# Patient Record
Sex: Female | Born: 1965 | Race: White | Hispanic: No | Marital: Married
Health system: Southern US, Community
[De-identification: ages and names within clinical notes are randomized; demographics above are authoritative.]

## PROBLEM LIST (undated history)

## (undated) DIAGNOSIS — G4730 Sleep apnea, unspecified: Secondary | ICD-10-CM

## (undated) HISTORY — DX: Sleep apnea, unspecified: G47.30

## (undated) HISTORY — PX: TONSILLECTOMY: SUR1361

## (undated) HISTORY — PX: FOOT SURGERY: SHX648

---

## 2015-02-04 ENCOUNTER — Other Ambulatory Visit: Payer: Self-pay | Admitting: Family Medicine

## 2015-02-04 DIAGNOSIS — Z1231 Encounter for screening mammogram for malignant neoplasm of breast: Secondary | ICD-10-CM

## 2015-02-09 ENCOUNTER — Ambulatory Visit
Admission: RE | Admit: 2015-02-09 | Discharge: 2015-02-09 | Disposition: A | Discharge status: Home / Self Care | Payer: Managed Care, Other (non HMO) | Source: Ambulatory Visit | Attending: Family Medicine | Admitting: Family Medicine

## 2015-02-09 DIAGNOSIS — Z1231 Encounter for screening mammogram for malignant neoplasm of breast: Secondary | ICD-10-CM

## 2016-05-11 ENCOUNTER — Other Ambulatory Visit: Payer: Self-pay | Admitting: Family Medicine

## 2016-05-11 DIAGNOSIS — Z1231 Encounter for screening mammogram for malignant neoplasm of breast: Secondary | ICD-10-CM

## 2017-02-20 ENCOUNTER — Encounter: Payer: Commercial Managed Care - PPO | Attending: Family Medicine | Admitting: *Deleted

## 2017-02-20 ENCOUNTER — Encounter: Payer: Self-pay | Admitting: *Deleted

## 2017-02-20 DIAGNOSIS — G4730 Sleep apnea, unspecified: Secondary | ICD-10-CM | POA: Diagnosis not present

## 2017-02-20 DIAGNOSIS — F509 Eating disorder, unspecified: Secondary | ICD-10-CM | POA: Diagnosis not present

## 2017-02-20 DIAGNOSIS — Z713 Dietary counseling and surveillance: Secondary | ICD-10-CM | POA: Diagnosis present

## 2017-02-20 NOTE — Progress Notes (Signed)
Medical Nutrition Therapy:  Appt start time: 0800 end time:  0900.   Assessment:  Primary concerns today: I have been working with her son on his disordered eating.  Ashley Charles states she wants to work with me also .  Diagnosed with mild/moderate sleep apnea.  Got C-Pap and feels so much better.  She has been feeling poorly for ages and taken multiple medications without help.  Is so pleased the past couple days.   Worked with another RD for weight loss and is transitioning to this provider.   Family (parents) history of disordered eating and body dysmorphia.  Son also with potential ARFID She is not happy in her skin.  She is concerned about her weight.  She thinks she is a binge eater.  States she knows what to eat as she has been through multiple diets and dietitians and programs.  She is so bombarded with information that she doesn't know how to take care of herself or her family.  She wants to be able to cook for her family.  She doesn't want to have to make 2 different meals.   Lost 100 pounds 9 years ago on her on going to [employer] and exercising.  Wrote everything down and thinks that was helpful.  She is steeped in diet mentality and feels she needs to eat differently than her family because she is larger.  She regained the weight and then lost it and then regained it.    She doesn't have clothes that she enjoys.  She doesnt' dress to take care of this body  States she eats bags of chocolate Is working with therapist on mindfulness in general.  Is not working on food.    EAT-26 score: 22 Binge 1/week  Preferred Learning Style:  No preference indicated   Learning Readiness:   Ready   MEDICATIONS: see list   DIETARY INTAKE:  Usual eating pattern includes 1-2 meals and 1 snacks per day.   Avoided foods include sugar (thinks it's a trigger), bread, dairy (likes siggy's yogurt).    24-hr recall:  B ( AM): organic valley protein shake  Snk ( AM): none  L ( PM): 2  handfuls cashews Snk ( PM): 4 butter buddies D ( PM): popcorn and large hot dog Snk ( PM): none Beverages: coffee with collagen powder added  Usual physical activity: none    Nutritional Diagnosis:  NB-1.5 Disordered eating pattern As related to meal skipping and binge eating.  As evidenced by dietary recall.    Intervention:  Nutrition counseling provided.  Binging will always occur if you don't get enough to eat.  She agreed to try to have 3 meals at the table.    Teaching Method Utilized: Auditory   Barriers to learning/adherence to lifestyle change: DE mindset  Demonstrated degree of understanding via:  Teach Back   Monitoring/Evaluation:in 2 week(s).

## 2017-03-05 ENCOUNTER — Ambulatory Visit: Payer: Managed Care, Other (non HMO) | Admitting: *Deleted

## 2017-03-19 ENCOUNTER — Ambulatory Visit: Payer: Managed Care, Other (non HMO) | Admitting: *Deleted

## 2017-03-27 ENCOUNTER — Encounter: Payer: Commercial Managed Care - PPO | Attending: Family Medicine | Admitting: *Deleted

## 2017-03-27 DIAGNOSIS — G4730 Sleep apnea, unspecified: Secondary | ICD-10-CM | POA: Diagnosis not present

## 2017-03-27 DIAGNOSIS — Z713 Dietary counseling and surveillance: Secondary | ICD-10-CM | POA: Insufficient documentation

## 2017-03-27 DIAGNOSIS — F509 Eating disorder, unspecified: Secondary | ICD-10-CM | POA: Diagnosis not present

## 2017-03-27 NOTE — Progress Notes (Signed)
Medical Nutrition Therapy:  Appt start time: 0800 end time:  0900.   Assessment:  Primary concerns today: I have been working with her son on his disordered eating.  Cala BradfordKimberly states she wants to work with me also  [facility].  Plans to stay with this provider Gracy BruinsMargaret Veech for therapy Is using CPap machine  States she "was horrible at eating 3 meals/day".  Still attends Overeaters Anonymous.    Preferred Learning Style:  No preference indicated   Learning Readiness:   Ready   MEDICATIONS: see list   DIETARY INTAKE:  Usual eating pattern includes 1-2 meals and 1 snacks per day.   Avoided foods include sugar (thinks it's a trigger), bread, dairy (likes siggy's yogurt).    24-hr recall:  B: trim healthy mama (collagen), granola and yogurt L: chocolate nuggets D: mcdonald's 1/4 pounder and fries.  water S: Ice cream cones 2  Beverages: water, coffee  Usual physical activity: swimming some    Nutritional Diagnosis:  NB-1.5 Disordered eating pattern As related to meal skipping and binge eating.  As evidenced by dietary recall.    Intervention:  Nutrition counseling provided.  Binging will always occur if you don't get enough to eat.  She agreed to try to have 3 meals at the table.  Suggested Body Worx group and talking to her therapist about experience with BED.   Challenged diet mentality.  Empowered her to make changes  Teaching Method Utilized: Auditory   Barriers to learning/adherence to lifestyle change: DE mindset  Demonstrated degree of understanding via:  Teach Back   Monitoring/Evaluation:in 2 week(s).

## 2017-03-27 NOTE — Patient Instructions (Signed)
Tess ALLTEL CorporationHoliday [address] Thore Jessamyn ProphetstownStanley Louise Green Mirna Valerio Jes Engineer, productionBaker Virgie Tovar Regan Chastain   Things No One Tells Fat Girls by Kirke ShaggyJes Baker

## 2017-04-10 ENCOUNTER — Encounter: Payer: Commercial Managed Care - PPO | Attending: Family Medicine | Admitting: *Deleted

## 2017-04-10 DIAGNOSIS — Z713 Dietary counseling and surveillance: Secondary | ICD-10-CM | POA: Insufficient documentation

## 2017-04-10 DIAGNOSIS — G4730 Sleep apnea, unspecified: Secondary | ICD-10-CM | POA: Diagnosis not present

## 2017-04-10 DIAGNOSIS — F509 Eating disorder, unspecified: Secondary | ICD-10-CM | POA: Diagnosis not present

## 2017-04-10 NOTE — Progress Notes (Signed)
Medical Nutrition Therapy:  Appt start time: 0800 end time:  0900.   Assessment:  Primary concerns today: I have been working with her son on his disordered eating.  Ashley Charles states she wants to work with me also   Hospital doctorWent to Clear Channel Communicationsoe's Kitchen last night and feels really good about it. She has been having more structured meals (they are from restaurants) but still meals.  Has also been making dinners at home the past couple nights Talked with husband about making time for herself and taking care of herself: 3 meals/day Going to bed on time Taking time in the morning for herself Going to appointments (meetings)* Plan dinners with husband ahead of time Walk more up and down the stairs- got a step to practice*.   Try swimming again Buy her own chocolate and not ask anyone else to*  Continues to see Ashley Charles, RD Signed up for Body Worx with [employer] .    Preferred Learning Style:  No preference indicated   Learning Readiness:   Ready   MEDICATIONS: see list   DIETARY INTAKE:  Usual eating pattern includes 1-2 meals and 1 snacks per day.   Avoided foods include sugar (thinks it's a trigger), bread, dairy (likes siggy's yogurt).       Nutritional Diagnosis:  NB-1.5 Disordered eating pattern As related to meal skipping and binge eating.  As evidenced by dietary recall.    Intervention:  Nutrition counseling provided.  Discussed conflicting messages from OA.  Discussed increasing body movement and structured meals   Teaching Method Utilized: Auditory   Barriers to learning/adherence to lifestyle change: DE mindset  Demonstrated degree of understanding via:  Teach Back   Monitoring/Evaluation:in 2 week(s).

## 2017-04-10 NOTE — Patient Instructions (Signed)
Step at home Silver Sneaker with mom Walking in the pool  [place] Islands[place]li Schoone with Librarian, academicAnchor Strength Training [phone] personal trainer  3 meals with starch and protein cooked in fat with fruit or vegetable on the side

## 2017-04-24 ENCOUNTER — Encounter: Payer: Commercial Managed Care - PPO | Admitting: *Deleted

## 2017-04-24 DIAGNOSIS — F5081 Binge eating disorder: Secondary | ICD-10-CM

## 2017-04-24 DIAGNOSIS — Z713 Dietary counseling and surveillance: Secondary | ICD-10-CM | POA: Diagnosis not present

## 2017-04-24 NOTE — Progress Notes (Signed)
Medical Nutrition Therapy:  Appt start time: 0800 end time:  0900.   Assessment:  Primary concerns today: BED  Has been eating 3 meals/day some days.  Has been eating less bags of chocolate.  Has been flipping through Intuitive Eating Also got the workbook that has various exercises.  Has questions about gentle nutrition Got prescription goggles for swimming      Talked with husband about making time for herself and taking care of herself: 3 meals/day Going to bed on time Taking time in the morning for herself Going to appointments (meetings)* Plan dinners with husband ahead of time Walk more up and down the stairs- got a step to practice*.   Try swimming again Buy her own chocolate and not ask anyone else to*      Preferred Learning Style:  No preference indicated   Learning Readiness:   Ready   MEDICATIONS: see list   DIETARY INTAKE:  Usual eating pattern includes 1-2 meals and 1 snacks per day.   Avoided foods include sugar (thinks it's a trigger), bread, dairy (likes siggy's yogurt).       Nutritional Diagnosis:  NB-1.5 Disordered eating pattern As related to meal skipping and binge eating.  As evidenced by dietary recall.    Intervention:  Nutrition counseling provided.  Discussed conflicting messages from OA.  Discussed structured meals   Teaching Method Utilized: Auditory   Barriers to learning/adherence to lifestyle change: DE mindset  Demonstrated degree of understanding via:  Teach Back   Monitoring/Evaluation:in 1 week(s).

## 2017-05-01 ENCOUNTER — Encounter: Payer: Commercial Managed Care - PPO | Admitting: *Deleted

## 2017-05-01 DIAGNOSIS — Z713 Dietary counseling and surveillance: Secondary | ICD-10-CM | POA: Diagnosis not present

## 2017-05-01 DIAGNOSIS — F509 Eating disorder, unspecified: Secondary | ICD-10-CM

## 2017-05-01 NOTE — Patient Instructions (Signed)
Anti-inflammatory foods High fruit and vegetable intake High fiber, whole grains Plant-based fats Spices and herbs Omega-3 fatty acid:  Supplement with Nature's Made [phone] mg DHA (take 2-4/day) and that is 3rd party tested also Kirdland brand       Foods include: olive oil, avocado, canola oil, flax seeds, macadamia nuts, hazelnuts, pecans, pistachios, pine nuts.  Salmon, tuna, trout, barramundi

## 2017-05-01 NOTE — Progress Notes (Signed)
Medical Nutrition Therapy:  Appt start time: 0800 end time:  0900.   Assessment:  Primary concerns today: BED  Went walking in the pool 2 days this week.  Started Body Worx Started reading Cendant Corporationhe Body is Not an Apology and is also listening in Audible.  Continues to read Intuitive Eating Trying to challenge diet mentality     Preferred Learning Style:  No preference indicated   Learning Readiness:   Ready   MEDICATIONS: see list   DIETARY INTAKE:  Usual eating pattern includes 1-2 meals and 1 snacks per day. B: coffee with collagen powder Protein shake Banana Taco bell: 2 tacos and burrito 2 ice cream cones     Nutritional Diagnosis:  NB-1.5 Disordered eating pattern As related to meal skipping and binge eating.  As evidenced by dietary recall.    Intervention:  Nutrition counseling provided.  Discussed conflicting messages from OA.  Discussed structured meals.  Discussed antiinflammatory foods   Teaching Method Utilized: Auditory   Barriers to learning/adherence to lifestyle change: DE mindset  Demonstrated degree of understanding via:  Teach Back   Monitoring/Evaluation:in 2 week(s).

## 2017-05-15 ENCOUNTER — Encounter: Payer: Commercial Managed Care - PPO | Attending: Family Medicine | Admitting: *Deleted

## 2017-05-15 DIAGNOSIS — Z713 Dietary counseling and surveillance: Secondary | ICD-10-CM | POA: Insufficient documentation

## 2017-05-15 DIAGNOSIS — G4730 Sleep apnea, unspecified: Secondary | ICD-10-CM | POA: Insufficient documentation

## 2017-05-15 DIAGNOSIS — F509 Eating disorder, unspecified: Secondary | ICD-10-CM | POA: Insufficient documentation

## 2017-05-15 NOTE — Patient Instructions (Addendum)
Anti-inflammatory foods  High fruit and vegetable intake  High fiber, whole grains  Plant-based fats  Spices and herbs  Omega-3 fatty acid:   Supplement with Nature's Made [phone] mg DHA (take 2-4/day) and that is 3rd party tested also Kirdland brand        Foods include: olive oil, avocado, canola oil, flax seeds, macadamia nuts, hazelnuts, pecans, pistachios, pine nuts.  Salmon, tuna, trout, barramundi   I'm so proud of you!  3 meals/day [facility] Meditation app

## 2017-05-15 NOTE — Progress Notes (Signed)
Medical Nutrition Therapy:  Appt start time: 0800 end time:  0900.   Assessment:  Primary concerns today: BED Hip feels better sometimes. Diagnosis hip bursitis.  Is not to exacerbate it.  Looked up asli, but is not going to pursue training just now.  Will do stretching, swimming, yoga   Has been eating bananas in the morning Often has toast with PB for lunch and then dinner with her family    Preferred Learning Style:  No preference indicated   Learning Readiness:   Change in progress  MEDICATIONS: see list      Nutritional Diagnosis:  NB-1.5 Disordered eating pattern As related to meal skipping and binge eating.  As evidenced by dietary recall.    Intervention:  Nutrition counseling provided.  Praised progress.  Discussed coping mechanisms.  Discussed body terrorism/body liberation  Teaching Method Utilized: Auditory   Barriers to learning/adherence to lifestyle change: DE mindset  Demonstrated degree of understanding via:  Teach Back   Monitoring/Evaluation:in 2 week(s).

## 2017-05-29 ENCOUNTER — Encounter: Payer: Commercial Managed Care - PPO | Admitting: *Deleted

## 2017-05-29 DIAGNOSIS — Z713 Dietary counseling and surveillance: Secondary | ICD-10-CM | POA: Diagnosis not present

## 2017-05-29 DIAGNOSIS — F509 Eating disorder, unspecified: Secondary | ICD-10-CM

## 2017-05-29 NOTE — Progress Notes (Signed)
Medical Nutrition Therapy:  Appt start time: 0800 end time:  0900.   Assessment:  Primary concerns today: BED Has many questions.  Is struggling with fat shame   Preferred Learning Style:  No preference indicated   Learning Readiness:   Change in progress  MEDICATIONS: see list  24 hour recall B: nothing L: nothing Bag chocolate stouffers lasagna, water    Nutritional Diagnosis:  NB-1.5 Disordered eating pattern As related to meal skipping and binge eating.  As evidenced by dietary recall.    Intervention:  Nutrition counseling provided.   Discussed coping mechanisms.  Discussed body terrorism/body liberation  Teaching Method Utilized: Auditory   Barriers to learning/adherence to lifestyle change: DE mindset  Demonstrated degree of understanding via:  Teach Back   Monitoring/Evaluation:in 2 week(s).

## 2017-05-29 NOTE — Patient Instructions (Addendum)
Ashley Charles  Things No One Tells Fat Girls  Ashley Charles in [place]  [facility] [phone]

## 2017-06-13 ENCOUNTER — Ambulatory Visit: Payer: Managed Care, Other (non HMO) | Admitting: *Deleted

## 2017-06-26 ENCOUNTER — Encounter: Payer: Commercial Managed Care - PPO | Attending: Family Medicine | Admitting: *Deleted

## 2017-06-26 DIAGNOSIS — G4730 Sleep apnea, unspecified: Secondary | ICD-10-CM | POA: Diagnosis not present

## 2017-06-26 DIAGNOSIS — F509 Eating disorder, unspecified: Secondary | ICD-10-CM | POA: Insufficient documentation

## 2017-06-26 DIAGNOSIS — Z713 Dietary counseling and surveillance: Secondary | ICD-10-CM | POA: Diagnosis present

## 2017-06-26 NOTE — Progress Notes (Signed)
Medical Nutrition Therapy:  Appt start time: 0800 end time:  0900.   Assessment:  Primary concerns today: BED Is reading Things No One Tells Fat Girls struggling   Preferred Learning Style:  No preference indicated   Learning Readiness:   Change in progress  MEDICATIONS: see list  24 hour recall poptarts No lunch.  2 cups coffee Eggs and bacon for dinner Brownie potarts   Nutritional Diagnosis:  NB-1.5 Disordered eating pattern As related to meal skipping and binge eating.  As evidenced by dietary recall.    Intervention:  Nutrition counseling provided.   Challenged diet mentality.  Discussed ellyn's satter's normal eating.  Suggested resources  Teaching Method Utilized: Auditory   Barriers to learning/adherence to lifestyle change: DE mindset  Demonstrated degree of understanding via:  Teach Back   Monitoring/Evaluation:in 2 week(s).

## 2017-07-10 ENCOUNTER — Encounter: Payer: Commercial Managed Care - PPO | Attending: Family Medicine | Admitting: *Deleted

## 2017-07-10 DIAGNOSIS — Z713 Dietary counseling and surveillance: Secondary | ICD-10-CM | POA: Insufficient documentation

## 2017-07-10 DIAGNOSIS — F509 Eating disorder, unspecified: Secondary | ICD-10-CM | POA: Diagnosis not present

## 2017-07-10 DIAGNOSIS — G4730 Sleep apnea, unspecified: Secondary | ICD-10-CM | POA: Diagnosis not present

## 2017-07-10 NOTE — Progress Notes (Signed)
Medical Nutrition Therapy:  Appt start time: 0800 end time:  0900.   Assessment:  Primary concerns today: BED Going to PT for her hip Had 3 meals yesterday and breakfast 2 days in a row Denied life insurance.  Not sure why Still reading Things No One Tells Fat Girls Bought Health At Every Size, hasn't started yet    Preferred Learning Style:  No preference indicated   Learning Readiness:   Change in progress  MEDICATIONS: see list  24 hour recall Coffee and coffeecake CFA nuggets and 6 cookies and tea 2 tacos and nuggets   Nutritional Diagnosis:  NB-1.5 Disordered eating pattern As related to meal skipping and binge eating.  As evidenced by dietary recall.    Intervention:  Nutrition counseling provided.   Keep trying Teaching Method Utilized: Auditory   Barriers to learning/adherence to lifestyle change: DE mindset  Demonstrated degree of understanding via:  Teach Back   Monitoring/Evaluation:in 2 week(s).

## 2017-07-24 ENCOUNTER — Telehealth: Payer: Self-pay | Admitting: *Deleted

## 2017-07-24 ENCOUNTER — Ambulatory Visit: Payer: Managed Care, Other (non HMO) | Admitting: *Deleted

## 2017-07-24 NOTE — Telephone Encounter (Signed)
Overslept.  Needs to cancel appt Talked for 25 minutes about her disordered eating

## 2017-08-08 ENCOUNTER — Encounter: Payer: Commercial Managed Care - PPO | Attending: Family Medicine | Admitting: *Deleted

## 2017-08-08 DIAGNOSIS — F509 Eating disorder, unspecified: Secondary | ICD-10-CM | POA: Insufficient documentation

## 2017-08-08 DIAGNOSIS — Z713 Dietary counseling and surveillance: Secondary | ICD-10-CM | POA: Diagnosis not present

## 2017-08-08 DIAGNOSIS — G4730 Sleep apnea, unspecified: Secondary | ICD-10-CM | POA: Insufficient documentation

## 2017-08-08 NOTE — Progress Notes (Signed)
Medical Nutrition Therapy:  Appt start time: 0900 end time:  1000.   Assessment:  Primary concerns today: BED Is pretty stressed so she is eating more.  Has therapy later today PT is helping some and no longer needs cane Ordered new clothes Is trying to love her body and study it   Preferred Learning Style:  No preference indicated   Learning Readiness:   Change in progress  MEDICATIONS: see list  24 hour recall Snacks milkshake   Nutritional Diagnosis:  NB-1.5 Disordered eating pattern As related to meal skipping and binge eating.  As evidenced by dietary recall.    Intervention:  Nutrition counseling provided.   Please continue to try 3 meals/day   Teaching Method Utilized: Auditory   Barriers to learning/adherence to lifestyle change: DE mindset  Demonstrated degree of understanding via:  Teach Back   Monitoring/Evaluation:in 2 week(s).

## 2017-08-22 ENCOUNTER — Ambulatory Visit: Payer: Managed Care, Other (non HMO) | Admitting: *Deleted

## 2017-10-17 ENCOUNTER — Encounter: Payer: Commercial Managed Care - PPO | Attending: Family Medicine | Admitting: *Deleted

## 2017-10-17 DIAGNOSIS — G4730 Sleep apnea, unspecified: Secondary | ICD-10-CM | POA: Insufficient documentation

## 2017-10-17 DIAGNOSIS — Z713 Dietary counseling and surveillance: Secondary | ICD-10-CM | POA: Insufficient documentation

## 2017-10-17 DIAGNOSIS — F509 Eating disorder, unspecified: Secondary | ICD-10-CM

## 2017-10-17 NOTE — Progress Notes (Signed)
Medical Nutrition Therapy:  Appt start time: 0945 end time:  1030   Assessment:  Primary concerns today: BED Is going to more ALANON meetings versus OA , but did go to OA yesterday and realizes the conflicting messages between OA and HAES Is part of a fat acceptance group on Facebook  Is really trying to get in 3 meals Was binging more , but this past week or so is less  Is continuing family therapy Hip is slowly getting better. [place] trip is coming up and gonna take walking sticks.  She's getting stronger.  Went for a walk last week   Preferred Learning Style:  No preference indicated   Learning Readiness:   Change in progress  MEDICATIONS: see list  24 hour recall Banana chocolate Lasagna and bread  This morning had coffee and coffee cake   Nutritional Diagnosis:  NB-1.5 Disordered eating pattern As related to meal skipping and binge eating.  As evidenced by dietary recall.    Intervention:  Nutrition counseling provided.   Please continue to try 3 meals/day.  unfollow triggering social media accounts   Teaching Method Utilized: Auditory   Barriers to learning/adherence to lifestyle change: DE mindset  Demonstrated degree of understanding via:  Teach Back   Monitoring/Evaluation:in 2 week(s).

## 2017-10-31 ENCOUNTER — Encounter: Payer: Commercial Managed Care - PPO | Admitting: *Deleted

## 2017-10-31 DIAGNOSIS — F5081 Binge eating disorder: Secondary | ICD-10-CM

## 2017-10-31 DIAGNOSIS — Z713 Dietary counseling and surveillance: Secondary | ICD-10-CM | POA: Diagnosis not present

## 2017-10-31 NOTE — Progress Notes (Signed)
Medical Nutrition Therapy:  Appt start time: 0800 end time:  0900   Assessment:  Primary concerns today: BED Just got back from [place] and walked a lot Ate 3 meals a day for the trip.   Felt empowered and wants to figure out how to stay consistent Knows she needs to separate her eating from her son's  Went for a few walks in her neighborhood before DC and that went ok Continues to do physical therapy and is getting stronger  Enrolled in intuitive eating group and will be reading the book Nourish  Preferred Learning Style:  No preference indicated   Learning Readiness:   Change in progress  MEDICATIONS: see list  24 hour recall Eggs, sausage, chocolate muffin and coffee Mal[place]urkey sandwich from subway and lays chips.  Water oreos     Nutritional Diagnosis:  NB-1.5 Disordered eating pattern As related to meal skipping and binge eating.  As evidenced by dietary recall.    Intervention:  Nutrition counseling provided.   Try for 10 minute walk each day.  Attend group.  Eat 3 meals   Teaching Method Utilized: Auditory   Barriers to learning/adherence to lifestyle change: DE mindset  Demonstrated degree of understanding via:  Teach Back   Monitoring/Evaluation:in 1 week (s).

## 2017-10-31 NOTE — Patient Instructions (Signed)
Mathis DadBrett debney

## 2017-11-05 ENCOUNTER — Encounter: Payer: Commercial Managed Care - PPO | Attending: Family Medicine | Admitting: *Deleted

## 2017-11-05 DIAGNOSIS — G4730 Sleep apnea, unspecified: Secondary | ICD-10-CM | POA: Insufficient documentation

## 2017-11-05 DIAGNOSIS — Z713 Dietary counseling and surveillance: Secondary | ICD-10-CM | POA: Diagnosis not present

## 2017-11-05 DIAGNOSIS — F509 Eating disorder, unspecified: Secondary | ICD-10-CM | POA: Diagnosis not present

## 2017-11-05 NOTE — Progress Notes (Signed)
Medical Nutrition Therapy:  Appt start time: 0730 end time:  0830   Assessment:  Primary concerns today: BED Started intuitive eating group yesterday.  Will be reading Nourish and Health At Every Size Did not finish Things No One Tells Fat Girls  yet  Playing phone tag with Mathis DadBrett Debney to get scheduled for therapy    Preferred Learning Style:  No preference indicated   Learning Readiness:   Change in progress  MEDICATIONS: see list  24 hour recall Apple Coffee cake and coffee Almonds, raisins Popcorn and swiss cheese Cheese and 6 oreos     Nutritional Diagnosis:  NB-1.5 Disordered eating pattern As related to meal skipping and binge eating.  As evidenced by dietary recall.    Intervention:  Nutrition counseling provided.   Try for 10 minute walk each day.  Attend group.  Eat 3 meals   Teaching Method Utilized: Auditory   Barriers to learning/adherence to lifestyle change: DE mindset  Demonstrated degree of understanding via:  Teach Back   Monitoring/Evaluation:in 1 week (s).

## 2017-11-12 ENCOUNTER — Encounter: Payer: Commercial Managed Care - PPO | Admitting: *Deleted

## 2017-11-12 DIAGNOSIS — Z713 Dietary counseling and surveillance: Secondary | ICD-10-CM | POA: Diagnosis not present

## 2017-11-12 DIAGNOSIS — F509 Eating disorder, unspecified: Secondary | ICD-10-CM

## 2017-11-12 NOTE — Progress Notes (Signed)
Medical Nutrition Therapy:  Appt start time: 0730 end time:  0830   Assessment:  Primary concerns today: BED Continues intuitive eating group.  continues reading Nourish and Health At Every Size  Scheduled with  Mathis DadBrett Debney for therapy    Preferred Learning Style:  No preference indicated   Learning Readiness:   Change in progress  MEDICATIONS: see list  24 hour recall Coffee and coffee cake Bag cadbury eggs 4 chicken strips Another bag of choclate    Nutritional Diagnosis:  NB-1.5 Disordered eating pattern As related to meal skipping and binge eating.  As evidenced by dietary recall.    Intervention:  Nutrition counseling provided.   Try for 10 minute walk each day/do PT work.  Attend group.  Eat 3 meals   Teaching Method Utilized: Auditory   Barriers to learning/adherence to lifestyle change: DE mindset  Demonstrated degree of understanding via:  Teach Back   Monitoring/Evaluation:in 1 week (s).

## 2017-11-19 ENCOUNTER — Encounter: Payer: Commercial Managed Care - PPO | Admitting: *Deleted

## 2017-11-19 DIAGNOSIS — F509 Eating disorder, unspecified: Secondary | ICD-10-CM

## 2017-11-19 DIAGNOSIS — Z713 Dietary counseling and surveillance: Secondary | ICD-10-CM | POA: Diagnosis not present

## 2017-11-19 NOTE — Progress Notes (Signed)
Medical Nutrition Therapy:  Appt start time: 0730 end time:  0830   Assessment:  Primary concerns today: BED Continues intuitive eating group.  continues reading Nourish and Health At Every Size  Scheduled with  Mathis DadBrett Debney for therapy Trying to walk some, but it hurts    Preferred Learning Style:  No preference indicated   Learning Readiness:   Change in progress  MEDICATIONS: see list Banana and coffee Frosted mini wheats (dried) with dried strawberries Spaghetti, 1 square of chocolate    Nutritional Diagnosis:  NB-1.5 Disordered eating pattern As related to meal skipping and binge eating.  As evidenced by dietary recall.    Intervention:  Nutrition counseling provided.   Try for 10 minute walk each day/do PT work.  Attend group.  Eat 3 meals   Teaching Method Utilized: Auditory   Barriers to learning/adherence to lifestyle change: DE mindset  Demonstrated degree of understanding via:  Teach Back   Monitoring/Evaluation:in 1 week (s).

## 2017-11-22 ENCOUNTER — Other Ambulatory Visit: Payer: Self-pay | Admitting: Family Medicine

## 2017-11-22 ENCOUNTER — Other Ambulatory Visit (HOSPITAL_COMMUNITY)
Admission: RE | Admit: 2017-11-22 | Discharge: 2017-11-22 | Disposition: A | Discharge status: Home / Self Care | Payer: Commercial Managed Care - PPO | Source: Ambulatory Visit | Attending: Family Medicine | Admitting: Family Medicine

## 2017-11-22 DIAGNOSIS — Z124 Encounter for screening for malignant neoplasm of cervix: Secondary | ICD-10-CM | POA: Diagnosis not present

## 2017-11-26 ENCOUNTER — Encounter: Payer: Commercial Managed Care - PPO | Admitting: *Deleted

## 2017-11-26 DIAGNOSIS — F509 Eating disorder, unspecified: Secondary | ICD-10-CM

## 2017-11-26 DIAGNOSIS — Z713 Dietary counseling and surveillance: Secondary | ICD-10-CM | POA: Diagnosis not present

## 2017-11-26 NOTE — Progress Notes (Signed)
Medical Nutrition Therapy:  Appt start time: 0730 end time:  0830   Assessment:  Primary concerns today: BED Continues intuitive eating group.  continues reading Nourish and Health At Every Size  Is trying to not harm herself through food and shame and blame any more.  Trying to show herself compassion  Did use arc trainer and that felt better than walking   Was pleased to find out that her weight is stable.  Had overall positive experience at PCP office. Was able to advocate for herself and felt heard and respected by the medical team BP was slightly elevated.Marland Kitchen.Marland Kitchen.Was recommended to try glucosamine and/or tumeric   MEDICATIONS: see list  Dietary Recall: Frosted mini wheat with milk Peanuts and raisins- late Baked ziti and bread Key lime pie     Nutritional Diagnosis:  NB-1.5 Disordered eating pattern As related to meal skipping and binge eating.  As evidenced by dietary recall.    Intervention:  Nutrition counseling provided.   Challenged diet culture. Praised progress. encouraged recovery.  3 meals and some body movement   Teaching Method Utilized: Auditory   Barriers to learning/adherence to lifestyle change: DE mindset  Demonstrated degree of understanding via:  Teach Back   Monitoring/Evaluation:in 1 week (s).

## 2017-11-27 LAB — CYTOLOGY - PAP
DIAGNOSIS: NEGATIVE
HPV (WINDOPATH): NOT DETECTED

## 2017-12-03 ENCOUNTER — Encounter: Payer: Commercial Managed Care - PPO | Attending: Family Medicine | Admitting: *Deleted

## 2017-12-03 DIAGNOSIS — G4730 Sleep apnea, unspecified: Secondary | ICD-10-CM | POA: Insufficient documentation

## 2017-12-03 DIAGNOSIS — F509 Eating disorder, unspecified: Secondary | ICD-10-CM | POA: Diagnosis not present

## 2017-12-03 DIAGNOSIS — Z713 Dietary counseling and surveillance: Secondary | ICD-10-CM | POA: Insufficient documentation

## 2017-12-03 DIAGNOSIS — F5081 Binge eating disorder: Secondary | ICD-10-CM

## 2017-12-03 NOTE — Progress Notes (Signed)
Medical Nutrition Therapy:  Appt start time: 0730 end time:  0830   Assessment:  Primary concerns today: BED Continues intuitive eating group.  continues reading Nourish and Health At Every Size  Did not engage in body movement this past week.  There are several barriers, but mostly because mentally she struggles with all or nothing mentality.  She is either "being good" and exercising, reading, etc or she isn't.  She also associates exercise with weight loss so it's hard to break that thought   MEDICATIONS: see list  Dietary Recall: 1 Toast with PB Carrot cake Breaded cod and fries Bag of chocolate      Nutritional Diagnosis:  NB-1.5 Disordered eating pattern As related to meal skipping and binge eating.  As evidenced by dietary recall.    Intervention:  Nutrition counseling provided.   Challenged cognitive distortions. Praised progress. encouraged recovery.  3 meals and some body movement   Teaching Method Utilized: Auditory   Barriers to learning/adherence to lifestyle change: DE mindset  Demonstrated degree of understanding via:  Teach Back   Monitoring/Evaluation:in 1 week (s).

## 2017-12-10 ENCOUNTER — Ambulatory Visit: Payer: Managed Care, Other (non HMO) | Admitting: *Deleted

## 2017-12-11 ENCOUNTER — Encounter: Payer: Commercial Managed Care - PPO | Admitting: *Deleted

## 2017-12-11 DIAGNOSIS — Z713 Dietary counseling and surveillance: Secondary | ICD-10-CM | POA: Diagnosis not present

## 2017-12-11 DIAGNOSIS — F5081 Binge eating disorder: Secondary | ICD-10-CM

## 2017-12-11 NOTE — Progress Notes (Signed)
Medical Nutrition Therapy:  Appt start time: 1400  end time:  1500   Assessment:  Primary concerns today: BED Continues intuitive eating group.  continues reading Nourish and Health At Every Size  Struggling to implement recovery messages and translate into action.  Dinner is hard as family time is stressful      Nutritional Diagnosis:  NB-1.5 Disordered eating pattern As related to meal skipping and binge eating.  As evidenced by dietary recall.    Intervention:  Nutrition counseling provided.   Challenged cognitive distortions. Praised progress. encouraged recovery.  3 meals and some body movement   Teaching Method Utilized: Auditory   Barriers to learning/adherence to lifestyle change: DE mindset  Demonstrated degree of understanding via:  Teach Back   Monitoring/Evaluation:in 1 week (s).

## 2017-12-17 ENCOUNTER — Encounter: Payer: Commercial Managed Care - PPO | Admitting: *Deleted

## 2017-12-17 DIAGNOSIS — F509 Eating disorder, unspecified: Secondary | ICD-10-CM

## 2017-12-17 DIAGNOSIS — Z713 Dietary counseling and surveillance: Secondary | ICD-10-CM | POA: Diagnosis not present

## 2017-12-17 NOTE — Progress Notes (Signed)
Medical Nutrition Therapy:  Appt start time: 0800  end time:  0900   Assessment:  Primary concerns today: BED Continues intuitive eating group.  continues reading Nourish and Health At Every Size  Group ended yesterday.  will resume in the fall.  Ok to be social over the summer Resuming reaading Health At Every Size and Things No One Tells Fat Girls Is helping reconcile the health part of Health at every size.  Where does self care in the form of nutrient dense foods and body movement fall?  Wants to talk about food ideas  24 hour recall Ensure Toast with PB Banana Trail mix spaghetti with sausage and bread Bag of eggs   Nutritional Diagnosis:  NB-1.5 Disordered eating pattern As related to meal skipping and binge eating.  As evidenced by dietary recall.    Intervention:  Nutrition counseling provided.   Challenged cognitive distortions. Praised progress. encouraged recovery.  3 meals and some body movement   Teaching Method Utilized: Auditory   Barriers to learning/adherence to lifestyle change: DE mindset  Demonstrated degree of understanding via:  Teach Back   Monitoring/Evaluation:in 1 week (s).

## 2017-12-19 ENCOUNTER — Other Ambulatory Visit: Payer: Self-pay | Admitting: Gastroenterology

## 2017-12-24 ENCOUNTER — Encounter: Payer: Commercial Managed Care - PPO | Admitting: *Deleted

## 2017-12-24 ENCOUNTER — Ambulatory Visit: Payer: Managed Care, Other (non HMO) | Admitting: *Deleted

## 2017-12-24 DIAGNOSIS — F5081 Binge eating disorder: Secondary | ICD-10-CM

## 2017-12-24 DIAGNOSIS — Z713 Dietary counseling and surveillance: Secondary | ICD-10-CM | POA: Diagnosis not present

## 2017-12-24 NOTE — Progress Notes (Signed)
Medical Nutrition Therapy:  Appt start time: 0900  end time:  1000   Assessment:  Primary concerns today: BED Finished intuitive eating group Ate very appropriate breakfast this morning and tried to eat as mindfully as she could Ate 3 meals yesterday and ate no chocolate.  Had 2 pieces of pie, but didn't finish it.  Started reading You Have The Right To Remain Fat Advocated for herself with Kerin SalenArya Karki [facility] and that went well    24 hour recall B: toast with PB and banana L: 2 cheese sticks and popcorn D: pancakes and bacon S: 2 pieces key lime pie Beverages: water and coffee   Nutritional Diagnosis:  NB-1.5 Disordered eating pattern As related to meal skipping and binge eating.  As evidenced by dietary recall.    Intervention:  Nutrition counseling provided.   Challenged cognitive distortions. Praised progress. encouraged recovery.  3 meals and some body movement.  Gave book recommendation for herself, for her to help her children, and books for her children to read themselves   Teaching Method Utilized: Auditory   Barriers to learning/adherence to lifestyle change: DE mindset  Demonstrated degree of understanding via:  Teach Back   Monitoring/Evaluation:in 1 week (s).

## 2017-12-31 ENCOUNTER — Encounter: Payer: Commercial Managed Care - PPO | Admitting: *Deleted

## 2017-12-31 DIAGNOSIS — F5081 Binge eating disorder: Secondary | ICD-10-CM

## 2017-12-31 DIAGNOSIS — Z713 Dietary counseling and surveillance: Secondary | ICD-10-CM | POA: Diagnosis not present

## 2017-12-31 NOTE — Progress Notes (Signed)
Medical Nutrition Therapy:  Appt start time: 1000  end time:  1100   Assessment:  Primary concerns today: BED Put effort into her appearance today.  This shows progress Thinking about food as an addiction.  Read an article about how it isn't.   Reading You Have The Right To Remain Fat Joined walking group    24 hour recall B: coffee cake L: pb toast with banana S: burger and a few fries D: spaghetti with butter and cheese S: 1/2 bag chocolate eggs S: another bag eggs Beverages: water and coffee   Nutritional Diagnosis:  NB-1.5 Disordered eating pattern As related to meal skipping and binge eating.  As evidenced by dietary recall.    Intervention:  Nutrition counseling provided.   Challenged cognitive distortions. Praised progress. encouraged recovery.  3 meals and some body movement.  Gave food log  Teaching Method Utilized: Auditory   Barriers to learning/adherence to lifestyle change: DE mindset  Demonstrated degree of understanding via:  Teach Back   Monitoring/Evaluation:in 1 week (s).

## 2018-01-07 ENCOUNTER — Encounter: Payer: Commercial Managed Care - PPO | Attending: Family Medicine | Admitting: *Deleted

## 2018-01-07 DIAGNOSIS — Z713 Dietary counseling and surveillance: Secondary | ICD-10-CM | POA: Insufficient documentation

## 2018-01-07 DIAGNOSIS — G4730 Sleep apnea, unspecified: Secondary | ICD-10-CM | POA: Insufficient documentation

## 2018-01-07 DIAGNOSIS — F509 Eating disorder, unspecified: Secondary | ICD-10-CM | POA: Diagnosis not present

## 2018-01-07 NOTE — Progress Notes (Signed)
Medical Nutrition Therapy:  Appt start time: 0800  end time:  0900   Assessment:  Primary concerns today: BED Signed up for running/walking group. Also started stretching her hip and that is helping Is really excited about coming through this and NOT focusing on weight.  Has movement all the other days  Didn't do food log: forgot  Ate half the bag of chocolate.  Normally would eat it all Stopped seeing other therapist who dismissed her for working with Genelle Bal Switched places at the table and feel that gives her more permission to take care of her during meals  Watched Shrill   24 hour recall B: PB and banana toast D: hot dog, baked beans, chips S: pie Beverages: water and coffee, sweet tea   Nutritional Diagnosis:  NB-1.5 Disordered eating pattern As related to meal skipping and binge eating.  As evidenced by dietary recall.    Intervention:  Nutrition counseling provided.   Challenged cognitive distortions. Praised progress. encouraged recovery.  3 meals and some body movement.  Please remember food log. Discussed fat bodies who exercise to follow on social media  Teaching Method Utilized: Auditory   Barriers to learning/adherence to lifestyle change: DE mindset  Demonstrated degree of understanding via:  Teach Back   Monitoring/Evaluation:in 1 week (s).

## 2018-01-07 NOTE — Patient Instructions (Signed)
@  [email or web address]        Savvy Girl: A guide to Eating

## 2018-01-14 ENCOUNTER — Encounter: Payer: Commercial Managed Care - PPO | Admitting: *Deleted

## 2018-01-14 DIAGNOSIS — F509 Eating disorder, unspecified: Secondary | ICD-10-CM

## 2018-01-14 DIAGNOSIS — Z713 Dietary counseling and surveillance: Secondary | ICD-10-CM | POA: Diagnosis not present

## 2018-01-14 NOTE — Progress Notes (Signed)
Medical Nutrition Therapy:  Appt start time: 0900  end time:  1000   Assessment:  Primary concerns today: BED  Started book club with all current or former OA people.  Reading Body Kindness Listened to Love, Food some. Went to gym some Kept food log and 1 day she ate totally normatively and didn't binge   24 hour recall pb toast with banana Trail mix Scrambled egg chocolate nuggets   Nutritional Diagnosis:  NB-1.5 Disordered eating pattern As related to meal skipping and binge eating.  As evidenced by dietary recall.    Intervention:  Nutrition counseling provided.    Praised progress. encouraged recovery.  3 meals and some body movement.  Discussed self care   Teaching Method Utilized: Auditory   Barriers to learning/adherence to lifestyle change: DE mindset  Demonstrated degree of understanding via:  Teach Back   Monitoring/Evaluation:in 1 week (s).

## 2018-01-21 ENCOUNTER — Encounter: Payer: Commercial Managed Care - PPO | Admitting: *Deleted

## 2018-01-21 DIAGNOSIS — F509 Eating disorder, unspecified: Secondary | ICD-10-CM

## 2018-01-21 DIAGNOSIS — Z713 Dietary counseling and surveillance: Secondary | ICD-10-CM | POA: Diagnosis not present

## 2018-01-21 NOTE — Progress Notes (Signed)
Medical Nutrition Therapy:  Appt start time: 0830  end time:  0930   Assessment:  Primary concerns today: BED Made a friend who is also working on recovery from disordered eating.  Continuing Body Kindness book club Continues to go to OA, but sporadically    24 hour recall NA   Nutritional Diagnosis:  NB-1.5 Disordered eating pattern As related to meal skipping and binge eating.  As evidenced by dietary recall.    Intervention:  Nutrition counseling provided.  Discussed self-compassion and the role of OA in her life.  Recommended Embody and Self Compassion   Teaching Method Utilized: Auditory   Barriers to learning/adherence to lifestyle change: DE mindset  Demonstrated degree of understanding via:  Teach Back   Monitoring/Evaluation:in 1 week (s).

## 2018-01-28 ENCOUNTER — Encounter: Payer: Commercial Managed Care - PPO | Admitting: *Deleted

## 2018-01-28 DIAGNOSIS — F509 Eating disorder, unspecified: Secondary | ICD-10-CM

## 2018-01-28 DIAGNOSIS — Z713 Dietary counseling and surveillance: Secondary | ICD-10-CM | POA: Diagnosis not present

## 2018-01-28 NOTE — Progress Notes (Signed)
Medical Nutrition Therapy:  Appt start time: 0800  end time:  0900   Assessment:  Primary concerns today: BED  Was not able to get in 3 meals any day last week Got together with intuitive eating group  Reading Savvy Girl and Body Kindness, but spends more time on social media instead of recovery messages  24 hour recall M&Ms Chips [employer] and some soda   Nutritional Diagnosis:  NB-1.5 Disordered eating pattern As related to meal skipping and binge eating.  As evidenced by dietary recall.    Intervention:  Nutrition counseling provided.   If her hip is hitting/use a cane for support and increase movement again to build strength.  Consider using time to read/listen to podcasts instead of surfing social media 3 meals/day  Teaching Method Utilized: Auditory   Barriers to learning/adherence to lifestyle change: DE mindset  Demonstrated degree of understanding via:  Teach Back   Monitoring/Evaluation:in 1 week

## 2018-02-05 ENCOUNTER — Encounter: Payer: Commercial Managed Care - PPO | Attending: Family Medicine | Admitting: *Deleted

## 2018-02-05 DIAGNOSIS — Z713 Dietary counseling and surveillance: Secondary | ICD-10-CM | POA: Insufficient documentation

## 2018-02-05 DIAGNOSIS — G4730 Sleep apnea, unspecified: Secondary | ICD-10-CM | POA: Insufficient documentation

## 2018-02-05 DIAGNOSIS — F509 Eating disorder, unspecified: Secondary | ICD-10-CM | POA: Diagnosis not present

## 2018-02-05 NOTE — Progress Notes (Signed)
Medical Nutrition Therapy:  Appt start time: 1500  end time:  1600   Assessment:  Primary concerns today: BED Got a cane and it's really assisting with movement Reading Body Kindness  Has felt overwhelmed by what choices to make when choosing foods   24 hour recall Coffee cake M&Ms Taco bell M&Ms   Nutritional Diagnosis:  NB-1.5 Disordered eating pattern As related to meal skipping and binge eating.  As evidenced by dietary recall.    Intervention:  Nutrition counseling provided.   3 meals/day with protein, starch, and fat   Teaching Method Utilized: Auditory   Barriers to learning/adherence to lifestyle change: DE mindset  Demonstrated degree of understanding via:  Teach Back   Monitoring/Evaluation:in 1 week

## 2018-02-05 NOTE — Patient Instructions (Signed)
Starches: Bread Rice Cereal Bagel Wrap Pasta Crackers Oatmeal potatoes   Proteins: Peanut butter Eggs Cheese Luncheon meat Soy patties Chicken Canned fish/fish sticks  Fats:  Butter Cream cheese Cheese Olive oil Salad dressing     fats

## 2018-02-11 ENCOUNTER — Encounter: Payer: Commercial Managed Care - PPO | Admitting: *Deleted

## 2018-02-11 DIAGNOSIS — F509 Eating disorder, unspecified: Secondary | ICD-10-CM

## 2018-02-11 DIAGNOSIS — Z713 Dietary counseling and surveillance: Secondary | ICD-10-CM | POA: Diagnosis not present
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
## 2018-02-18 ENCOUNTER — Encounter: Payer: Commercial Managed Care - PPO | Admitting: *Deleted

## 2018-02-18 DIAGNOSIS — Z713 Dietary counseling and surveillance: Secondary | ICD-10-CM | POA: Diagnosis not present

## 2018-02-18 DIAGNOSIS — F509 Eating disorder, unspecified: Secondary | ICD-10-CM

## 2018-02-18 NOTE — Progress Notes (Signed)
Medical Nutrition Therapy:  Appt start time: 0730  end time:  0830   Assessment:  Primary concerns today: BED Started reading Self-Compassion Did meal support group for the first time yesterday.  Wasn't super hungry before and was level 6 after. Knew that meant she needed a snack.  Has questions about her own set point  Has a plan for breakfast  24 hour recall B: none L: 1 slice tost with pb and banana S: chips D: biscuits and gravy S: M&Ms   Nutritional Diagnosis:  NB-1.5 Disordered eating pattern As related to meal skipping and binge eating.  As evidenced by dietary recall.    Intervention:  Nutrition counseling provided.   3 meals/day with protein, starch, and fat.  Read Self-Compassion book   Teaching Method Utilized: Auditory   Barriers to learning/adherence to lifestyle change: DE mindset  Demonstrated degree of understanding via:  Teach Back   Monitoring/Evaluation:in 1 week

## 2018-02-26 ENCOUNTER — Encounter: Payer: Commercial Managed Care - PPO | Admitting: *Deleted

## 2018-02-26 DIAGNOSIS — Z713 Dietary counseling and surveillance: Secondary | ICD-10-CM | POA: Diagnosis not present

## 2018-02-26 DIAGNOSIS — F509 Eating disorder, unspecified: Secondary | ICD-10-CM

## 2018-02-26 NOTE — Progress Notes (Signed)
Medical Nutrition Therapy:  Appt start time: 0800  end time:  0900   Assessment:  Primary concerns today: BED  Started reading Self-Compassion; took assessment and scored low overall for self-compassion Had home-cooked meals 5 nights last week and she cooked 4 of them  Has appt with [place] this week.  24 hour recall Ensure (11 am) M&Ms Ice cream Beer 11 pm ice cream   Nutritional Diagnosis:  NB-1.5 Disordered eating pattern As related to meal skipping and binge eating.  As evidenced by dietary recall.    Intervention:  Nutrition counseling provided.   3 meals/day with protein, starch, and fat. Put those in your calendar. Establish structure with your family around meals and snacks   Teaching Method Utilized: Auditory   Barriers to learning/adherence to lifestyle change: DE mindset  Demonstrated degree of understanding via:  Teach Back   Monitoring/Evaluation:in 1 week

## 2018-03-04 ENCOUNTER — Encounter: Payer: Commercial Managed Care - PPO | Attending: Family Medicine | Admitting: *Deleted

## 2018-03-04 DIAGNOSIS — G4730 Sleep apnea, unspecified: Secondary | ICD-10-CM | POA: Diagnosis not present

## 2018-03-04 DIAGNOSIS — F509 Eating disorder, unspecified: Secondary | ICD-10-CM | POA: Diagnosis not present

## 2018-03-04 DIAGNOSIS — Z713 Dietary counseling and surveillance: Secondary | ICD-10-CM | POA: Insufficient documentation

## 2018-03-04 NOTE — Progress Notes (Signed)
Medical Nutrition Therapy:  Appt start time: 1000  end time:  1100   Assessment:  Primary concerns today: BED  Had appt with [place] for [place].  Has another appt scheduled tomorrow. Had dinner every night either homemade or take out.  Most were at the table.  Has loosely started talking about not having food in other rooms besides the kitchen/dining room.  Had 3 meals at least 2 days   24 hour recall B: none L: bean soup and crackers D: pancakes, sausage, hashbrowns, OJ S: M&Ms, but not as many   Nutritional Diagnosis:  NB-1.5 Disordered eating pattern As related to meal skipping and binge eating.  As evidenced by dietary recall.    Intervention:  Nutrition counseling provided.   3 meals/day with protein, starch, and fat. Put those in your calendar. Establish structure with your family around meals and snacks Discussed reading/hearing from fat bodies in preparation for upcoming flight  Teaching Method Utilized: Auditory   Barriers to learning/adherence to lifestyle change: DE mindset  Demonstrated degree of understanding via:  Teach Back   Monitoring/Evaluation:in 1 week

## 2018-03-11 ENCOUNTER — Encounter (HOSPITAL_COMMUNITY): Admission: RE | Payer: Self-pay | Source: Ambulatory Visit

## 2018-03-11 ENCOUNTER — Ambulatory Visit (HOSPITAL_COMMUNITY)
Admission: RE | Admit: 2018-03-11 | Payer: Commercial Managed Care - PPO | Source: Ambulatory Visit | Admitting: Gastroenterology

## 2018-03-11 ENCOUNTER — Ambulatory Visit: Payer: Commercial Managed Care - PPO | Admitting: *Deleted

## 2018-03-11 SURGERY — COLONOSCOPY
Anesthesia: Monitor Anesthesia Care

## 2018-03-18 ENCOUNTER — Ambulatory Visit: Payer: Commercial Managed Care - PPO | Admitting: *Deleted

## 2018-03-25 ENCOUNTER — Ambulatory Visit: Payer: Commercial Managed Care - PPO | Admitting: *Deleted

## 2018-04-01 ENCOUNTER — Encounter: Payer: Commercial Managed Care - PPO | Admitting: *Deleted

## 2018-04-01 DIAGNOSIS — F509 Eating disorder, unspecified: Secondary | ICD-10-CM

## 2018-04-01 DIAGNOSIS — Z713 Dietary counseling and surveillance: Secondary | ICD-10-CM | POA: Diagnosis not present

## 2018-04-01 NOTE — Progress Notes (Signed)
Medical Nutrition Therapy:  Appt start time: 1100  end time:  1200   Assessment:  Primary concerns today: BED  Ate 3 meals/day for 4 days in a row and then stopped.  On those days she didn't eat M&Ms Binged last night when she only ate 1 meal. Cancelled Asli Schoone Reading BIg Girl by Paulla DollyKElsey Miller     Nutritional Diagnosis:  NB-1.5 Disordered eating pattern As related to meal skipping and binge eating.  As evidenced by dietary recall.    Intervention:  Nutrition counseling provided.   3 meals/day with protein, starch, and fat. Put those in your calendar. Establish structure with your family around meals and snacks   Listen to podcast/book to eat breakfast by yourself  Teaching Method Utilized: Auditory   Barriers to learning/adherence to lifestyle change: DE mindset  Demonstrated degree of understanding via:  Teach Back   Monitoring/Evaluation:in 1 week

## 2018-04-09 ENCOUNTER — Encounter: Payer: Commercial Managed Care - PPO | Attending: Family Medicine | Admitting: *Deleted

## 2018-04-09 DIAGNOSIS — F509 Eating disorder, unspecified: Secondary | ICD-10-CM | POA: Diagnosis not present

## 2018-04-09 DIAGNOSIS — Z713 Dietary counseling and surveillance: Secondary | ICD-10-CM | POA: Insufficient documentation

## 2018-04-09 DIAGNOSIS — G4730 Sleep apnea, unspecified: Secondary | ICD-10-CM | POA: Insufficient documentation

## 2018-04-09 NOTE — Progress Notes (Signed)
Medical Nutrition Therapy:  Appt start time: 0800  end time:  0900   Assessment:  Primary concerns today: BED Learned that her dad had arthritis and all kinds of joint issues.  That was so revealing to learn that her issues are genetic and not because of herweight  Found pictures from her youth and learned she was much smaller than she thought; even though the doctors told her she was "too big" she was not at all  Still seeing [place] for personal training.  Has been more active this past week- working in the garage.  Is more achy from the increased movement  Ate 3 meals 5 days this past week!!  Breakfast is bar, shake, or yogurt and granola  Finished Big Girl and loved it Started reading Hunger  24 hour recall B: granola and yogurt, coffee L: panera roast [place] sandwich with avocado , soup D: mcdonald's burger and fries S: 6 ice cream bars Beverages: water    Nutritional Diagnosis:  NB-1.5 Disordered eating pattern As related to meal skipping and binge eating.  As evidenced by dietary recall.    Intervention:  Nutrition counseling provided.   3 meals/day with protein, starch, and fat. Put those in your calendar. Establish structure with your family around meals and snacks  Listen to podcast/book to eat breakfast by yourself  Teaching Method Utilized: Auditory   Barriers to learning/adherence to lifestyle change: DE mindset  Demonstrated degree of understanding via:  Teach Back   Monitoring/Evaluation:in 1 week

## 2018-04-15 ENCOUNTER — Encounter: Payer: Commercial Managed Care - PPO | Admitting: *Deleted

## 2018-04-15 DIAGNOSIS — F509 Eating disorder, unspecified: Secondary | ICD-10-CM

## 2018-04-15 DIAGNOSIS — Z713 Dietary counseling and surveillance: Secondary | ICD-10-CM | POA: Diagnosis not present

## 2018-04-15 NOTE — Progress Notes (Signed)
Medical Nutrition Therapy:  Appt start time: 0730 end time:  0830   Assessment:  Primary concerns today: BED   Kids are starting back at school and she is trying to figure out how to plan meals and have enough energy Doesn't want to eat with her mom as mom makes noise.    Protein bar to hold her over and then yogurt and granola after dropping kids off at school Lunch could be ham sandwich or PB and banana toast   Doing Body Kindness book club with OA friends Working with [place]  24 hour recall B: protein bar L: M&Ms D: pizza and beer S: M&Ms    Nutritional Diagnosis:  NB-1.5 Disordered eating pattern As related to meal skipping and binge eating.  As evidenced by dietary recall.    Intervention:  Nutrition counseling provided.   3 meals/day with protein, starch, and fat. Consider Embodied Eating group and meal planning group  Teaching Method Utilized: Auditory   Barriers to learning/adherence to lifestyle change: DE mindset  Demonstrated degree of understanding via:  Teach Back   Monitoring/Evaluation:in 1 week

## 2018-04-23 ENCOUNTER — Encounter: Payer: Commercial Managed Care - PPO | Admitting: *Deleted

## 2018-04-23 DIAGNOSIS — F509 Eating disorder, unspecified: Secondary | ICD-10-CM

## 2018-04-23 DIAGNOSIS — Z713 Dietary counseling and surveillance: Secondary | ICD-10-CM | POA: Diagnosis not present

## 2018-04-23 NOTE — Progress Notes (Signed)
Medical Nutrition Therapy:  Appt start time: 0800 end time:  0900   Assessment:  Primary concerns today: BED Finished Big Girl and started reading Hunger Walked a lot last night at kids' open house (did use can)but it was much better than in the past.  Is still in a good deal of pain, but working with [place].  Is trying to do some of the yoga poses at home  Ate 3 meals a day most days this past week  24 hour recall B: hard boiled egg, some oatmeal, yogurt, some smoothie, some juice M&Ms L: fish sandwich, some fries, tea M&Ms D: chicken tenders S: 2 ice cream bars    Nutritional Diagnosis:  NB-1.5 Disordered eating pattern As related to meal skipping and binge eating.  As evidenced by dietary recall.    Intervention:  Nutrition counseling provided.   3 meals/day with protein, starch, and fat. Consider Embodied Eating group and meal planning group Continue with body movement, as able  Teaching Method Utilized: Auditory   Barriers to learning/adherence to lifestyle change: DE mindset  Demonstrated degree of understanding via:  Teach Back   Monitoring/Evaluation:in 1 week

## 2018-05-06 ENCOUNTER — Encounter: Payer: Commercial Managed Care - PPO | Attending: Family Medicine | Admitting: *Deleted

## 2018-05-06 DIAGNOSIS — F509 Eating disorder, unspecified: Secondary | ICD-10-CM | POA: Diagnosis not present

## 2018-05-06 DIAGNOSIS — Z713 Dietary counseling and surveillance: Secondary | ICD-10-CM | POA: Diagnosis present

## 2018-05-06 DIAGNOSIS — G4730 Sleep apnea, unspecified: Secondary | ICD-10-CM | POA: Insufficient documentation

## 2018-05-06 NOTE — Progress Notes (Signed)
Medical Nutrition Therapy:  Appt start time: 0830 end time:  0930   Assessment:  Primary concerns today: BED  reading Hunger, but not any more than last time  still seeing Ashley Charles and they might put an exercise (pilates) class together as Ashley Charles used to be an Secondary school teacher Starting Ashley Charles embodied eating group next week Is trying to get a fat social group together   24 hour recall B: 2 protein bars Pint pf fish food ice cream Bowl sun chips Noose yogurt    Nutritional Diagnosis:  NB-1.5 Disordered eating pattern As related to meal skipping and binge eating.  As evidenced by dietary recall.    Intervention:  Nutrition counseling provided.   3 meals/day with protein, starch, and fat.   Teaching Method Utilized: Auditory   Barriers to learning/adherence to lifestyle change: DE mindset  Demonstrated degree of understanding via:  Teach Back   Monitoring/Evaluation:in 1 week

## 2018-05-13 ENCOUNTER — Encounter: Payer: Commercial Managed Care - PPO | Admitting: *Deleted

## 2018-05-13 DIAGNOSIS — Z713 Dietary counseling and surveillance: Secondary | ICD-10-CM | POA: Diagnosis not present

## 2018-05-13 DIAGNOSIS — F509 Eating disorder, unspecified: Secondary | ICD-10-CM

## 2018-05-13 NOTE — Progress Notes (Signed)
Medical Nutrition Therapy:  Appt start time: 1045  end time:  1130   Assessment:  Primary concerns today: BED  Embodied eating group started today.  Discussed principle 1 :rejecting diet mentality Doesn't recognize hunger cues and in the morning she feels like she has to force herself to eat  Weight is up about 20 pounds since April and she is struggling with that  Feeding herself and her family is very difficult to plan.  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  As evidenced by dietary recall.    Intervention:  Nutrition counseling provided.   3 meals/day with protein, starch, and fat. Discussed how that is so much more important than the number on the scale   Teaching Method Utilized: Auditory   Barriers to learning/adherence to lifestyle change: DE mindset  Demonstrated degree of understanding via:  Teach Back   Monitoring/Evaluation:in 1 week

## 2018-05-20 ENCOUNTER — Encounter: Payer: Commercial Managed Care - PPO | Admitting: *Deleted

## 2018-05-20 DIAGNOSIS — F509 Eating disorder, unspecified: Secondary | ICD-10-CM

## 2018-05-20 DIAGNOSIS — Z713 Dietary counseling and surveillance: Secondary | ICD-10-CM | POA: Diagnosis not present

## 2018-05-20 NOTE — Progress Notes (Signed)
Medical Nutrition Therapy:  Appt start time: 1100  end time:  1200   Assessment:  Primary concerns today: BED   In e,mbodies eating group:Intuitive eating principles- honoring hunger and taking up space Guided imagery today was on self compassion  Mom is not eating enough and that is affecting Ashley Charles, as well as her son Ashley Charles who also has disordered eating  At last physical glucose and cholesterol are elevated.  Will take Body Respect and Paradigm Shift to appointment with PCP next week  24 hour recall NA    Nutritional Diagnosis:  NB-1.5 Disordered eating pattern As related to meal skipping and binge eating.  As evidenced by dietary recall.    Intervention:  Nutrition counseling provided.   3 meals/day with protein, starch, and fat.  Please read Ashley Charles Self Compassion Could also check out Ashley Charles Binge Eating Disorder: They Journey to Recovery and Beyond  3 meals with protein, starch, fat Breakfast: yogurt and granola Lunch: PB and banana sandwich and cheese stick, apple, goldfish crackers  Teaching Method Utilized: Auditory   Barriers to learning/adherence to lifestyle change: DE mindset  Demonstrated degree of understanding via:  Teach Back   Monitoring/Evaluation:in 1 week

## 2018-05-20 NOTE — Patient Instructions (Addendum)
Please read Clovia CuffKristin Neff Self Compassion Could also check out Chevese Turner Binge Eating Disorder: They Journey to Recovery and Beyond  3 meals with protein, starch, fat Breakfast: yogurt and granola Lunch: PB and banana sandwich and cheese stick, apple, goldfish crackers

## 2018-05-27 ENCOUNTER — Encounter: Payer: Commercial Managed Care - PPO | Admitting: *Deleted

## 2018-05-27 DIAGNOSIS — Z713 Dietary counseling and surveillance: Secondary | ICD-10-CM | POA: Diagnosis not present

## 2018-05-27 DIAGNOSIS — F509 Eating disorder, unspecified: Secondary | ICD-10-CM

## 2018-05-27 NOTE — Progress Notes (Signed)
Medical Nutrition Therapy:  Appt start time: 1100  end time:  1200   Assessment:  Primary concerns today: BED  Still moving forward with fat bodied pilates   Saw a chiropractor who was helpful to determine that she has more compensatory joint and muscle issues rather than a tear 3rd intuitive eating principle in embodied  Group Had dinner with body positive group in [place]! And is having dinner with nourish group tomorrow   24 hour recall Yogurt and granola pb on english muffin Kind bar Eggs and english muffin with butter Ice cream sandwich and nutter butters    Nutritional Diagnosis:  NB-1.5 Disordered eating pattern As related to meal skipping and binge eating.  As evidenced by dietary recall.    Intervention:  Nutrition counseling provided.   3 meals/day with protein, starch, and fat.  She is interested in [employer]     Teaching Method Utilized: Auditory   Barriers to learning/adherence to lifestyle change: DE mindset  Demonstrated degree of understanding via:  Teach Back   Monitoring/Evaluation:in 1 week

## 2018-06-03 ENCOUNTER — Encounter: Payer: Commercial Managed Care - PPO | Attending: Family Medicine | Admitting: *Deleted

## 2018-06-03 DIAGNOSIS — F509 Eating disorder, unspecified: Secondary | ICD-10-CM | POA: Diagnosis not present

## 2018-06-03 DIAGNOSIS — G4730 Sleep apnea, unspecified: Secondary | ICD-10-CM | POA: Insufficient documentation

## 2018-06-03 DIAGNOSIS — Z713 Dietary counseling and surveillance: Secondary | ICD-10-CM | POA: Diagnosis present

## 2018-06-03 NOTE — Progress Notes (Signed)
Medical Nutrition Therapy:  Appt start time: 1130  end time:  1230   Assessment:  Primary concerns today: BED  Saw a chiropractor who was helpful to determine that she has more compensatory joint and muscle issues initiating in her foot probably from her foot surgery 6 years ago  Reading Intuitive Eating for group.  Bought Self-Compassion workbook.  Taking medications every day now  24 hour recall Yogurt and granola Protein bar and M&Ms Burger and fries and M&Ms  3 meals most days,  Not all but most.  interested in adding/expanding her food options Thinking about moving from mind control to M&Ms to mindfulness Lunch is still tricky.  Breakfast ususally happens and dinner always does but lunch is hard   Nutritional Diagnosis:  NB-1.5 Disordered eating pattern As related to meal skipping and binge eating.  As evidenced by dietary recall.    Intervention:  Nutrition counseling provided.   3 meals/day with protein, starch, and fat. Try to add green smoothie once    Teaching Method Utilized: Auditory   Barriers to learning/adherence to lifestyle change: DE mindset  Demonstrated degree of understanding via:  Teach Back   Monitoring/Evaluation:in 1 week

## 2018-06-10 ENCOUNTER — Encounter: Payer: Commercial Managed Care - PPO | Admitting: *Deleted

## 2018-06-10 DIAGNOSIS — Z713 Dietary counseling and surveillance: Secondary | ICD-10-CM | POA: Diagnosis not present

## 2018-06-10 DIAGNOSIS — F509 Eating disorder, unspecified: Secondary | ICD-10-CM

## 2018-06-10 NOTE — Progress Notes (Signed)
Medical Nutrition Therapy:  Appt start time: 1030  end time:  1130   Assessment:  Primary concerns today: BED  IBS was acting up this morning so didn't go to intuitive eating group No M&Ms I the house  Has not made her smoothies yet as it's too much work to make and because she's afraid of the diet culture thoughts   24 hour recall Coffee and 1/2 pumpkin scone Carrot cake (threw away half) Protein bar [employer] Carrot cake Ice cream bar     Nutritional Diagnosis:  NB-1.5 Disordered eating pattern As related to meal skipping and binge eating.  As evidenced by dietary recall.    Intervention:  Nutrition counseling provided.   3 meals/day with protein, starch, and fat. Try to add green smoothie once    Teaching Method Utilized: Auditory   Barriers to learning/adherence to lifestyle change: DE mindset  Demonstrated degree of understanding via:  Teach Back   Monitoring/Evaluation:in 1 week

## 2018-06-17 ENCOUNTER — Encounter: Payer: Commercial Managed Care - PPO | Admitting: *Deleted

## 2018-06-17 DIAGNOSIS — Z713 Dietary counseling and surveillance: Secondary | ICD-10-CM | POA: Diagnosis not present

## 2018-06-17 DIAGNOSIS — F509 Eating disorder, unspecified: Secondary | ICD-10-CM

## 2018-06-17 NOTE — Progress Notes (Signed)
Medical Nutrition Therapy:  Appt start time: 0830  end time:  0930   Assessment:  Primary concerns today: BED  Continues to work with chiropractor and it continues to help.  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  That incredible that she was physically able   Still hasn't made green smoothie  24 hour recall B: yogurt and granola L: perfect bar D: biscuits and gravy S: triscuits M&Ms- whole bag      Nutritional Diagnosis:  NB-1.5 Disordered eating pattern As related to meal skipping and binge eating.  As evidenced by dietary recall.    Intervention:  Nutrition counseling provided.   3 meals/day with protein, starch, and fat. That are more calorically adequate Discussed [place] starvation study on what eating ~1500-1600 kcal /day does --> binge Discussed adding a snack     Teaching Method Utilized: Auditory   Barriers to learning/adherence to lifestyle change: DE mindset  Demonstrated degree of understanding via:  Teach Back   Monitoring/Evaluation:in 1 week

## 2018-06-24 ENCOUNTER — Encounter: Payer: Commercial Managed Care - PPO | Admitting: *Deleted

## 2018-06-24 DIAGNOSIS — F509 Eating disorder, unspecified: Secondary | ICD-10-CM

## 2018-06-24 DIAGNOSIS — Z713 Dietary counseling and surveillance: Secondary | ICD-10-CM | POA: Diagnosis not present

## 2018-06-24 NOTE — Progress Notes (Signed)
Medical Nutrition Therapy:  Appt start time: 1100  end time:  1200   Assessment:  Primary concerns today: BED  Went to [place] and was able to walk in the park.  Was also able to stand a good while at the high school reunion. And was able to walk into today without pain. Flying was ok.  Got her seatbelt extender without issue  In embodied eating group discussed satiety and satisfaction Thought about this with breakfast.  Which has been hard and now she would like to make a home-cooked breakfast for herself and her kids: eggs and toast  Ate 3 meals/day while gone   24 hour recall B: 1 scrambled eggs with some biscuit and gravy.  Didn't like it so ate toast with jelly L: pastie (meat pie) D: airport and M&Ms       Nutritional Diagnosis:  NB-1.5 Disordered eating pattern As related to meal skipping and binge eating.  As evidenced by dietary recall.    Intervention:  Nutrition counseling provided.   Reflected that when she eats adequately and allows herself to have sweets during the day, she doesn't NEED the M&Ms.  Needs to eat adequately and have permission to eat satisfying food during the day  Discussed how she still isn't getting enough satiety and satisfaction from food and that once she is adequately fed, she may have the urge to binge for emotional needs. She has unmet emotional needs and making friends could help     Monitoring/Evaluation:in 4 weeks with this provider, 1 week with Mirian Capuchin

## 2018-07-02 ENCOUNTER — Encounter: Payer: Commercial Managed Care - PPO | Admitting: Registered"

## 2018-07-02 DIAGNOSIS — Z713 Dietary counseling and surveillance: Secondary | ICD-10-CM

## 2018-07-02 NOTE — Progress Notes (Signed)
Medical Nutrition Therapy:  Appt start time: 8:05  end time:  9:15   Assessment:  Primary concerns today: BED  Pt arrives having some hip and leg pain from volunteering [place] [employer] this weekend. Pt states she likes to read. Pt states she read Big Girl by Paulla Dolly learning Intuitive Eating. Pt states she is attending [employer] memoir event tonight in [place]. Pt states she has been helping her mom eat breakfast. Pt states she has been loosely studying Intuitive Eating principles. Pt states she knows she has an emotional  component to eating and learning to put herself first and balance family life. Pt states she has a lot of appts this week and has been unable to eat as intended. Pt states she is also a a part of facebook Body Positive small group; going out to eat with them this afternoon. Pt states she sees personal traininer once a week.    24 hour recall B: Perfect bar  L: toast + peanut butter due to having a lot of appointments this week S: 3 ice cream bars D: white chicken chili + tortilla chips       Nutritional Diagnosis:  NB-1.5 Disordered eating pattern As related to meal skipping and binge eating.  As evidenced by dietary recall.    Intervention:  Nutrition counseling provided. Client was encouraged to continue with the great work she has put into making changes and honoring her body.   Monitoring/Evaluation: in 1 week with this provider, in 3 weeks with Danise Edge.

## 2018-07-09 ENCOUNTER — Ambulatory Visit: Payer: Commercial Managed Care - PPO | Admitting: Registered"

## 2018-07-15 ENCOUNTER — Ambulatory Visit: Payer: Commercial Managed Care - PPO | Admitting: Registered"

## 2018-07-22 ENCOUNTER — Encounter: Payer: Commercial Managed Care - PPO | Attending: Family Medicine | Admitting: *Deleted

## 2018-07-22 DIAGNOSIS — F509 Eating disorder, unspecified: Secondary | ICD-10-CM | POA: Insufficient documentation

## 2018-07-22 DIAGNOSIS — Z713 Dietary counseling and surveillance: Secondary | ICD-10-CM | POA: Diagnosis not present

## 2018-07-22 DIAGNOSIS — G4730 Sleep apnea, unspecified: Secondary | ICD-10-CM | POA: Diagnosis not present

## 2018-07-22 NOTE — Progress Notes (Signed)
Medical Nutrition Therapy:  Appt start time: 0930  end time:  1030   Assessment:  Primary concerns today: BED  Working through [employer]Comp[employer]ion workbook, but just started Mom has been hospitalized She's missed a couple visits with providers the past couple weeks  Hasn't been eating 3 meals/day, but has been eating something.  Did eat Perfect Bar in the mornings and then a snack and a meal, but after mom came home from the hospital things have gotten really disorganized    24 hour recall M7Ms around 1 pm Casserole More M&Ms.  Ate smaller portion and didn't want more.  And that was weird for her  Realizes she is struggling with self-compassion Realizes she is not present when she eats Struggling with how her mom triggers her eating disorder and body image  She's trying to think about being more mindful       Nutritional Diagnosis:  NB-1.5 Disordered eating pattern As related to meal skipping and binge eating.  As evidenced by dietary recall.    Intervention:  Nutrition counseling provided.   Discussed mindfulness and self-compassion   Monitoring/Evaluation: will follow up in 1 week

## 2018-07-29 ENCOUNTER — Encounter: Payer: Commercial Managed Care - PPO | Admitting: *Deleted

## 2018-07-29 DIAGNOSIS — Z713 Dietary counseling and surveillance: Secondary | ICD-10-CM | POA: Diagnosis not present

## 2018-07-29 DIAGNOSIS — F509 Eating disorder, unspecified: Secondary | ICD-10-CM

## 2018-07-29 NOTE — Progress Notes (Signed)
Medical Nutrition Therapy:  Appt start time: 0730  end time:  0830   Assessment:  Primary concerns today: BED  Ate 3 times yesterday.  One time was a perfect bar Reading multiple books currently about anti racism work, mindfullnes, is reading so many things that she isn't really making much progress with any of them  Is busy with taking care of her mother, etc She's so worn out emotionally from carying all her family's burdens.  She doesn't feel heard and became emotional in session today.  While she has made progress with challenging the diet mentality and food police and she is moving her body in more enjoyable ways and gaining strength and flexibility, she is really not making much progress with feeding herself adequately.  And that is hard because she is so bogged down with her family's needs and also preparing meals is really emotionally tough.  She isn't getting the support she needs at home     Nutritional Diagnosis:  NB-1.5 Disordered eating pattern As related to meal skipping and binge eating.  As evidenced by dietary recall.    Intervention:  Nutrition counseling provided.   Discussed mindfulness and self-compassion and the potential need for a higher level of care   Monitoring/Evaluation: will follow up in 1 week

## 2018-07-29 NOTE — Patient Instructions (Signed)
Higher level of care   PHP (partial hospitalization) [facility], [address], [facility]  [facility] (intensive outpatient) [facility], [facility]

## 2018-08-05 ENCOUNTER — Encounter: Payer: Commercial Managed Care - PPO | Attending: Family Medicine | Admitting: *Deleted

## 2018-08-05 DIAGNOSIS — Z713 Dietary counseling and surveillance: Secondary | ICD-10-CM | POA: Insufficient documentation

## 2018-08-05 DIAGNOSIS — F509 Eating disorder, unspecified: Secondary | ICD-10-CM

## 2018-08-05 DIAGNOSIS — G4730 Sleep apnea, unspecified: Secondary | ICD-10-CM | POA: Insufficient documentation

## 2018-08-05 NOTE — Progress Notes (Signed)
Medical Nutrition Therapy:  Appt start time: 0830  end time:  0930   Assessment:  Primary concerns today: BED  Informed client that I will be leaving [place] at the end of the year.  She would like to follow [facility] to [facility] Has been considering more care/support for her eating disorder, but isn't ready to move forward yet.  Would like more structure outpatient  Thinks she needs more of a plan for dinner.  Still doesn't really get a lunch.  She thinks she isn't capable of planning and cooking and that isn't true.  Her anxiety and ADHD get in the way    Nutritional Diagnosis:  NB-1.5 Disordered eating pattern As related to meal skipping and binge eating.  As evidenced by dietary recall.    Intervention:  Nutrition counseling provided.   Gave meal suggestions Please talk with psychiatrist about medication   Monitoring/Evaluation: will follow up in 1 week

## 2018-08-05 NOTE — Patient Instructions (Addendum)
consider having a starch, protein, fat, and maybe fruit/veggie  Breakfast:  -yogurt and granola -Toast with honey and pb -Bagel with cream cheese and hard boiled egg (or smoked salmon) -Oatmeal made with milk with brown sugar and nuts     Lunch:  -pb and butter or jelly sandwich or honey  On bread or waffle or bagel or any starch product and chips and/or baby carrots or yogurt or fruit -Carved [place] sandwich with cranberry sauce, and side item as above -carved ham sandwich with side item -canned bean soup with crackers -Canned tomato soup with grilled grilled cheese sandwich -canned chicken noodle soup with cracker or chips  Dinner:  - [place] pastie -schwans braised pork with rolls and mac-n-cheese and veggie tray - refrigerated pie crust with: equal part canned chicken, drained canned mixed veggies, can cream of chicken soup mixed.  Bake at 375 ish for 25 ish minutes -spaghetti with bagged salad -pizza -tacos with refried bean -frozen lasagna with bagged salad    Podcasts: Food psych Dietitians unplugged  File superbill from [facility]

## 2018-08-06 ENCOUNTER — Telehealth: Payer: Self-pay | Admitting: *Deleted

## 2018-08-06 NOTE — Telephone Encounter (Signed)
Check in with patient after she received distressing news Suggested support groups and friends Ensure adequate nutrition though food and/or supplements

## 2018-08-12 ENCOUNTER — Encounter: Payer: Commercial Managed Care - PPO | Admitting: *Deleted

## 2018-08-12 DIAGNOSIS — F509 Eating disorder, unspecified: Secondary | ICD-10-CM

## 2018-08-12 DIAGNOSIS — Z713 Dietary counseling and surveillance: Secondary | ICD-10-CM | POA: Diagnosis not present

## 2018-08-12 NOTE — Progress Notes (Signed)
Medical Nutrition Therapy:  Appt start time: 0830  end time:  0930   Assessment:  Primary concerns today: BED  Lunch is still a struggle and doesn't happen daily.   Last week we created some ideas for meals and Ashley Charles went to the grocery store yesterday to get stuff for lunches.  Forgets what is discussed in session all week.  It is so difficult for her to organize her thoughts and take care of her needs.  Her household is very stressful and she doesn't get any support, but it is very difficult for her to take care of herself through eating 3 meals a day.  Part of that is diet culture telling her that food has to be perfect so then she just doesn't eat, part of that is lack of hunger cues as a result of starvation mode, part of that is her brain is starved so she can't think straight.  In many ways she has made progress these past 18 months, but she has yet to eat 3 meals/day of any sort consistently.  And because of this lack of nutrition, life is hard    Nutritional Diagnosis:  NB-1.5 Disordered eating pattern As related to meal skipping and binge eating.  As evidenced by dietary recall.    Intervention:  Nutrition counseling provided.   Please talk with psychiatrist about medication for ADHD Discussed options for care after I leave [place] Discussed need for 3 meals and what actually are the barriers.  Had her set alarms in her phone to eat lunch   Monitoring/Evaluation: will follow up in 1 week

## 2018-08-19 ENCOUNTER — Encounter: Payer: Commercial Managed Care - PPO | Admitting: *Deleted

## 2018-08-19 DIAGNOSIS — F509 Eating disorder, unspecified: Secondary | ICD-10-CM

## 2018-08-19 DIAGNOSIS — Z713 Dietary counseling and surveillance: Secondary | ICD-10-CM | POA: Diagnosis not present

## 2018-08-19 NOTE — Progress Notes (Signed)
Medical Nutrition Therapy:  Appt start time: 0900  end time:  1000   Assessment:  Primary concerns today: BED  Husband lost his job and Selena BattenKim looses insurance and thus treatment Not consistent with 3 meals a day  Uses escapism as a coping mechanism: Facebook/tv/M&Ms   Nutritional Diagnosis:  NB-1.5 Disordered eating pattern As related to meal skipping and binge eating.  As evidenced by dietary recall.    Intervention:  Nutrition counseling provided.    Mostly listened.  Challenged concept of "control" vs "being in charge" and Maslow's Heirarchy of Needs.  Discussed asking for help from her children/husband and setting boundaries around Facebook time Discussed [employer] grant funds, [facility], and free support groups.  What serves her?  In terms of time and food.   Monitoring/Evaluation: [facility] 1 week

## 2018-08-29 ENCOUNTER — Ambulatory Visit: Payer: Commercial Managed Care - PPO | Admitting: Registered"

## 2018-09-01 ENCOUNTER — Encounter: Payer: Self-pay | Admitting: Registered"

## 2018-09-01 ENCOUNTER — Encounter: Payer: Commercial Managed Care - PPO | Admitting: Registered"

## 2018-09-01 DIAGNOSIS — Z713 Dietary counseling and surveillance: Secondary | ICD-10-CM | POA: Diagnosis not present

## 2018-09-01 DIAGNOSIS — F509 Eating disorder, unspecified: Secondary | ICD-10-CM

## 2018-09-01 NOTE — Progress Notes (Signed)
Medical Nutrition Therapy:  Appt start time: 0900  end time:  1000  Therapist: Mathis DadBrett Debney  Assessment:  Primary concerns today: BED  Pt arrives today stating she realizes malnutrition needs to be fixed by not skipping meals and having 3 meals a day. Pt states she thinks she has caused a lot of damage to her body with weight cycling. Pt states she is trying to make peace with current body.   Pt states she will be able to continue nutrition and therapy sessions due to [employer] coverage. Pt states she wants to use me as meal support, seeing me 3x/week but will verify with insurance coverage. Pt is currently reading new release Anti-Diet; enjoying it.   Pt states she has history of doing 3 meals/day and snack at night before mom had stroke then stopped. Pt states she needs someone to make her do things. Pt states breakfast and lunch are the hardest for her. States mom doesn't eat breakfast, mom lives with them. Pt states she feels barriers are: not having things on hand to complete recipe, feels defeated when walking in kitchen, learning self compassion   Uses escapism as a coping mechanism: Facebook/tv/M&Ms   Nutritional Diagnosis:  NB-1.5 Disordered eating pattern As related to meal skipping and binge eating.  As evidenced by dietary recall.    Intervention:  Nutrition counseling provided.    Mostly listened. Encouraged pt with having self-compassion and counseled on ways to slowly increase intake to nourish body.   Goals: - Aim to have at least 1 other meal during the day other than dinner.   Monitoring/Evaluation: will follow up in 1 week

## 2018-09-01 NOTE — Patient Instructions (Signed)
-   Aim to have at least 1 other meal during the day other than dinner.

## 2018-09-08 ENCOUNTER — Ambulatory Visit: Payer: Commercial Managed Care - PPO | Admitting: Registered"

## 2018-09-16 ENCOUNTER — Ambulatory Visit: Payer: Commercial Managed Care - PPO | Admitting: Registered"

## 2019-07-09 ENCOUNTER — Encounter: Payer: Medicaid Other | Attending: Family Medicine | Admitting: Registered"

## 2019-07-09 DIAGNOSIS — F509 Eating disorder, unspecified: Secondary | ICD-10-CM | POA: Insufficient documentation

## 2019-07-09 DIAGNOSIS — F5081 Binge eating disorder: Secondary | ICD-10-CM | POA: Insufficient documentation

## 2019-07-09 NOTE — Progress Notes (Signed)
This visit was completed virtually due to the COVID-19 pandemic.   I spoke with Ashley Charles and verified that I was speaking with the correct person with two patient identifiers (full name and date of birth).   I discussed the limitations related to this kind of visit and the patient is willing to proceed.  Medical Nutrition Therapy:  Appt start time: 11:30  end time:  12:30  Therapist: Esperanza Heir  Assessment:  Primary concerns today: BED  Pt states she has less mobility due to hip issues, left leg, and left knee. Reports she has gained weight since previous visit about a year ago. States she eats peanut M&Ms all the time. States she sleeps better when not eating until bedtime. States she is working on forgiveness and self-compassion. States she had to exercise as a child and does not like to do it now. Interested in [place] movement catered to people in larger bodies. States she feels free when having 3 meals + 0 snacks. Reports concern her family may run out of money beyond Dec 2020.   Previous appt: Pt arrives today stating she realizes malnutrition needs to be fixed by not skipping meals and having 3 meals a day. Pt states she thinks she has caused a lot of damage to her body with weight cycling. Pt states she is trying to make peace with current body.   Pt states she will be able to continue nutrition and therapy sessions due to [place] coverage. Pt states she wants to use me as meal support, seeing me 3x/week but will verify with insurance coverage. Pt is currently reading new release Anti-Diet; enjoying it.   Pt states she has history of doing 3 meals/day and snack at night before mom had stroke then stopped. Pt states she needs someone to make her do things. Pt states breakfast and lunch are the hardest for her. States mom doesn't eat breakfast, mom lives with them. Pt states she feels barriers are: not having things on hand to complete recipe, feels defeated when walking in  kitchen, learning self compassion   Uses escapism as a coping mechanism: Facebook/tv/M&Ms   Nutritional Diagnosis:  NB-1.5 Disordered eating pattern As related to meal skipping and binge eating.  As evidenced by dietary recall.    Intervention:  Nutrition counseling provided.    Mostly listened. Encouraged pt with having self-compassion.    Monitoring/Evaluation: will follow up in 1 week

## 2019-07-16 ENCOUNTER — Encounter: Payer: Self-pay | Admitting: Registered"

## 2019-07-16 ENCOUNTER — Encounter: Payer: Medicaid Other | Admitting: Registered"

## 2019-07-16 DIAGNOSIS — F5081 Binge eating disorder: Secondary | ICD-10-CM | POA: Diagnosis not present

## 2019-07-16 DIAGNOSIS — F509 Eating disorder, unspecified: Secondary | ICD-10-CM

## 2019-07-16 NOTE — Progress Notes (Signed)
This visit was completed virtually due to the COVID-19 pandemic.   I spoke with Daine Gip and verified that I was speaking with the correct person with two patient identifiers (full name and date of birth).   I discussed the limitations related to this kind of visit and the patient is willing to proceed.  Medical Nutrition Therapy:  Appt start time: 11:30  end time:  12:30  Therapist: Esperanza Heir  Assessment:  Primary concerns today: BED  Pt states she she needs help with structure. Reports she ate a sandwich at 3am last night that husband made for her. States she is a big fruits and vegetables person. Reports making decisions are hard when walking into the kitchen. She feels like she should know more, know how to cook more, and her family wouldn't be the way it is if it wasn't for her. States she can eat the same thing daily for breakfast and lunch. Allows youngest son to pick out his own food during the day. States they do not have meals together. Reports preparing meals can be challenging because unable to stand for long periods of time. States she needs permission to eat the food and have a weekly schedule for meals. Reports she does not handle aspartame well.   Previous appts: Pt states she has less mobility due to hip issues, left leg, and left knee. States she is working on forgiveness and self-compassion. States she had to exercise as a child and does not like to do it now. Interested in [place] movement catered to people in larger bodies. States she feels free when having 3 meals + 0 snacks. Reports concern her family may run out of money beyond Dec 2020.   Pt arrives today stating she realizes malnutrition needs to be fixed by not skipping meals and having 3 meals a day. Pt states she thinks she has caused a lot of damage to her body with weight cycling. Pt states she is trying to make peace with current body.   Pt states she will be able to continue nutrition and therapy  sessions due to [employer] coverage. Pt states she wants to use me as meal support, seeing me 3x/week but will verify with insurance coverage. Pt is currently reading new release Anti-Diet; enjoying it.   Pt states she has history of doing 3 meals/day and snack at night before mom had stroke then stopped. Pt states she needs someone to make her do things. Pt states breakfast and lunch are the hardest for her. States mom doesn't eat breakfast, mom lives with them. Pt states she feels barriers are: not having things on hand to complete recipe, feels defeated when walking in kitchen, learning self compassion   Uses escapism as a coping mechanism: Facebook/tv/M&Ms   Nutritional Diagnosis:  NB-1.5 Disordered eating pattern As related to meal skipping and binge eating.  As evidenced by dietary recall.    Intervention:  Nutrition counseling provided.    Mostly listened. Encouraged pt with having self-compassion. Discussed breakfast and lunch options provided by previous Registered Dietitian that pt has o-hand already. Discussed mindful eating and having lunch with youngest son. Explained its ok to have structure in knowing what to eat in meals related to food groups. Pt was in agreement with goals listed.  Goals: - Have lunch with youngest son.   Monitoring/Evaluation: will follow up in 1 week

## 2019-07-16 NOTE — Patient Instructions (Signed)
-   Have lunch with youngest son.

## 2019-07-23 ENCOUNTER — Encounter: Payer: Medicaid Other | Admitting: Registered"

## 2019-07-23 DIAGNOSIS — F509 Eating disorder, unspecified: Secondary | ICD-10-CM

## 2019-07-23 DIAGNOSIS — F5081 Binge eating disorder: Secondary | ICD-10-CM | POA: Diagnosis not present

## 2019-07-23 NOTE — Patient Instructions (Signed)
-   Make spaghetti over a few days at a time. Ex. Make sauce one day and noodles the next day.

## 2019-07-23 NOTE — Progress Notes (Signed)
This visit was completed virtually due to the COVID-19 pandemic.   I spoke with Daine Gip and verified that I was speaking with the correct person with two patient identifiers (full name and date of birth).   I discussed the limitations related to this kind of visit and the patient is willing to proceed.  Medical Nutrition Therapy:  Appt start time: 2:00  end time:  3:00  Therapist: Esperanza Heir  Assessment:  Primary concerns today: BED  Pt states she has been working on having 3 meals a day; achieved 2x/week since last week. Has also had a few meals with son to help with mindful eating. Reports "all or nothing" perspective on things. Reports drinking plant-based milk because cow's milk does not agree with her at times.   Previous appts: Pt states she she needs help with structure. Reports making decisions are hard when walking into the kitchen. She feels like she should know more, know how to cook more, and her family wouldn't be the way it is if it wasn't for her. Pt states she has less mobility due to hip issues, left leg, and left knee. Reports preparing meals can be challenging because unable to stand for long periods of time. States she is working on forgiveness and self-compassion. States she had to exercise as a child and does not like to do it now. Interested in [place] movement catered to people in larger bodies. States she feels free when having 3 meals + 0 snacks. Reports concern her family may run out of money beyond Dec 2020.   Pt arrives today stating she realizes malnutrition needs to be fixed by not skipping meals and having 3 meals a day. Pt states she thinks she has caused a lot of damage to her body with weight cycling. Pt states she is trying to make peace with current body.   Pt states she will be able to continue nutrition and therapy sessions due to [place] coverage. Pt states she wants to use me as meal support, seeing me 3x/week but will verify with insurance  coverage. Pt is currently reading new release Anti-Diet; enjoying it.   Pt states she has history of doing 3 meals/day and snack at night before mom had stroke then stopped. Pt states she needs someone to make her do things. Pt states breakfast and lunch are the hardest for her. States mom doesn't eat breakfast, mom lives with them. Pt states she feels barriers are: not having things on hand to complete recipe, feels defeated when walking in kitchen, learning self compassion   Uses escapism as a coping mechanism: Facebook/tv/M&Ms  24 hour recall:  B: rice chex + almond milk + 1/2 banana L: sometimes skips or bagel + cream cheese + 6-7 slices of bacon D: sometimes skips S: blueberry bagel + butter   Nutritional Diagnosis:  NB-1.5 Disordered eating pattern As related to meal skipping and binge eating.  As evidenced by dietary recall.    Intervention:  Nutrition counseling provided.    Mostly listened. Discussed no perfect way to eat or prepare foods. Enouraged to work at a pace that works for her and where she is now; meal prep taking more than 1 day. Discussed 3 options for breakfast, lunch, and dinner that she can have. Pt was in agreement with goals listed.  Goals: - Make spaghetti over a few days at a time. Ex. Make sauce one day and noodles the next day.   Monitoring/Evaluation: will follow up in 2 weeks.

## 2019-08-04 ENCOUNTER — Encounter: Payer: Self-pay | Admitting: Registered"

## 2019-08-04 ENCOUNTER — Encounter: Payer: Medicaid Other | Attending: Family Medicine | Admitting: Registered"

## 2019-08-04 DIAGNOSIS — F5081 Binge eating disorder: Secondary | ICD-10-CM | POA: Diagnosis present

## 2019-08-04 DIAGNOSIS — F509 Eating disorder, unspecified: Secondary | ICD-10-CM | POA: Insufficient documentation

## 2019-08-04 NOTE — Progress Notes (Signed)
This visit was completed virtually due to the COVID-19 pandemic.   I spoke with Daine Gip and verified that I was speaking with the correct person with two patient identifiers (full name and date of birth).   I discussed the limitations related to this kind of visit and the patient is willing to proceed.  Medical Nutrition Therapy:  Appt start time: 4:05  end time:  5:00  Therapist: Esperanza Heir  Assessment:  Primary concerns today: BED  Pt states she has a good Thanksgiving and prepared it for her family. States she solicited help from others to accomplish the task. States they prepared: stuffing, sweet potato casserole, [place] with gravy, mashed potatoes, cranberry jello, green beans, cranberry sauce, and pumpkin pie. Still continuing to work on having 3 meals a day.  Previous appts:  Reports drinking plant-based milk because cow's milk does not agree with her at times.  Pt states she she needs help with structure. Reports making decisions are hard when walking into the kitchen. She feels like she should know more, know how to cook more, and her family wouldn't be the way it is if it wasn't for her. Pt states she has less mobility due to hip issues, left leg, and left knee. Reports preparing meals can be challenging because unable to stand for long periods of time. States she is working on forgiveness and self-compassion. States she had to exercise as a child and does not like to do it now. Interested in [place] movement catered to people in larger bodies. States she feels free when having 3 meals + 0 snacks. Reports concern her family may run out of money beyond Dec 2020.   Pt arrives today stating she realizes malnutrition needs to be fixed by not skipping meals and having 3 meals a day. Pt states she thinks she has caused a lot of damage to her body with weight cycling. Pt states she is trying to make peace with current body.   Pt states she will be able to continue nutrition and  therapy sessions due to [place] coverage. Pt states she wants to use me as meal support, seeing me 3x/week but will verify with insurance coverage. Pt is currently reading new release Anti-Diet; enjoying it.   Pt states she has history of doing 3 meals/day and snack at night before mom had stroke then stopped. Pt states she needs someone to make her do things. Pt states breakfast and lunch are the hardest for her. States mom doesn't eat breakfast, mom lives with them. Pt states she feels barriers are: not having things on hand to complete recipe, feels defeated when walking in kitchen, learning self compassion   Uses escapism as a coping mechanism: Facebook/tv/M&Ms  24 hour recall:  B (10 am): Perfect bar or rice chex + almond milk + 1/2 banana L: sometimes skips or bagel + cream cheese + 6-7 slices of bacon D: 3 slices of frozen pizza or sometimes skips S: bagel + cream cheese S: bag of peanut M&Ms   Nutritional Diagnosis:  NB-1.5 Disordered eating pattern As related to meal skipping and binge eating.  As evidenced by dietary recall.    Intervention:  Nutrition counseling provided. Mostly listened and encouraged ways to work at a pace in the kitchen that works for her and where she is now; meal prep taking more than 1 day. Pt was in agreement with goals listed.  Goals: - Make spaghetti over a few days at a time. Ex. Make sauce one  day and noodles the next day.   Monitoring/Evaluation: will follow up in 1 week.

## 2019-08-04 NOTE — Patient Instructions (Addendum)
-   Make spaghetti over a few days at a time. Ex. Make sauce one day and noodles the next day.  

## 2019-08-11 ENCOUNTER — Ambulatory Visit: Payer: Medicaid Other | Admitting: Registered"

## 2019-08-11 DIAGNOSIS — F509 Eating disorder, unspecified: Secondary | ICD-10-CM

## 2019-08-11 DIAGNOSIS — F5081 Binge eating disorder: Secondary | ICD-10-CM | POA: Diagnosis not present

## 2019-08-13 ENCOUNTER — Encounter: Payer: Self-pay | Admitting: Registered"

## 2019-08-13 NOTE — Progress Notes (Signed)
This visit was completed virtually due to the COVID-19 pandemic.   I spoke with Daine Gip and verified that I was speaking with the correct person with two patient identifiers (full name and date of birth).   I discussed the limitations related to this kind of visit and the patient is willing to proceed.  This note is not being shared with the patient for the following reason: To prevent harm (release of this note would result in harm to the life or physical safety of the patient or another).   Medical Nutrition Therapy:  Appt start time: 2:00  end time:  3:01  Therapist: Esperanza Heir  Assessment:  Primary concerns today: BED  Pt states she is still working on trying to prep meals. States tonight she is making biscuits and sausage gravy for her family. States she is not good with vegetables and does not know how to cook them; wants to learn.   Previous appts: Pt states she she needs help with structure. Reports making decisions are hard when walking into the kitchen. She feels like she should know more, know how to cook more, and her family wouldn't be the way it is if it wasn't for her. Pt states she has less mobility due to hip issues, left leg, and left knee. Reports preparing meals can be challenging because unable to stand for long periods of time. States she is working on forgiveness and self-compassion. States she had to exercise as a child and does not like to do it now. Interested in [place] movement catered to people in larger bodies. States she feels free when having 3 meals + 0 snacks. Reports concern her family may run out of money beyond Dec 2020.   Pt states she has history of doing 3 meals/day and snack at night before mom had stroke then stopped. Pt states she needs someone to make her do things. Pt states breakfast and lunch are the hardest for her. States mom doesn't eat breakfast, mom lives with them. Pt states she feels barriers are: not having things on hand to  complete recipe, feels defeated when walking in kitchen, learning self compassion   Uses escapism as a coping mechanism: Facebook/tv/M&Ms  24 hour recall:  B (10 am): bagel + cream cheese + coffee with collagen L:  S: m&m's + Doritos D: McDonald's-fish sandwich + 1/2 small fry + water  Beverages: water, pop, coffee  Nutritional Diagnosis:  NB-1.5 Disordered eating pattern As related to meal skipping and binge eating.  As evidenced by dietary recall.    Intervention:  Nutrition counseling provided. Mostly listened and encouraged ways to work at a pace in the kitchen that works for her and where she is now; meal prep taking more than 1 day. Discussed ways to prepare vegetables. Pt was in agreement with goals listed.   Handouts given during visit include:  Bake, Broil, Grilled (emailed to patient)  Monitoring/Evaluation: will follow up in 1 week.

## 2019-08-18 ENCOUNTER — Encounter: Payer: Medicaid Other | Admitting: Registered"

## 2019-08-18 ENCOUNTER — Encounter: Payer: Self-pay | Admitting: Registered"

## 2019-08-18 DIAGNOSIS — F5081 Binge eating disorder: Secondary | ICD-10-CM | POA: Diagnosis not present

## 2019-08-18 DIAGNOSIS — F509 Eating disorder, unspecified: Secondary | ICD-10-CM

## 2019-08-18 NOTE — Progress Notes (Signed)
This visit was completed virtually due to the COVID-19 pandemic.   I spoke with Daine Gip and verified that I was speaking with the correct person with two patient identifiers (full name and date of birth).   I discussed the limitations related to this kind of visit and the patient is willing to proceed.  This note is not being shared with the patient for the following reason: To prevent harm (release of this note would result in harm to the life or physical safety of the patient or another).   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 States she is working on asking what she needs in the moments of snacking at the end of the night. States she has a hard time figuring out what to eat when going into the kitchen and when eating away from home. Shares experience of having perfect bar for breakfast yesterday and leaving home to get her son out of the house. He changes his mind about eating at a fast food restaurant, they don't stop there to eat, and continue to run a few errands and pt does not eat until she returns home. Reports having a chicken breast and a cherry turnover. States she snacked later that night while watching a movie.   Uses escapism as a coping mechanism: Facebook/tv/M&Ms  24 hour recall:  B: perfect bar L: rotisserie chicken + cherry turnover  S:  D: none S: 1/2 party size bag peanut M&M's + 1/2 bag plain chips  Beverages: water, pop, coffee  Nutritional Diagnosis:  NB-1.5 Disordered eating pattern As related to meal skipping and binge eating.  As evidenced by dietary recall.    Intervention:  Nutrition counseling provided. Discussed meeting nutritional needs throughout the day with meals. Discussed having a meal prior to leaving home if that would create less anxiety of figuring out  what to eat while out running errands. Affirms pt she is doing a great job taking care of her family and she deserves to take care of herself with the same tenacity. Discussed being intentional with eating breakfast, lunch, and dinner.    Monitoring/Evaluation: will follow up in 2 weeks.

## 2019-08-19 ENCOUNTER — Ambulatory Visit
Admission: RE | Admit: 2019-08-19 | Discharge: 2019-08-19 | Disposition: A | Discharge status: Home / Self Care | Payer: Medicaid Other | Source: Ambulatory Visit | Attending: Family Medicine | Admitting: Family Medicine

## 2019-08-19 ENCOUNTER — Other Ambulatory Visit: Payer: Self-pay | Admitting: Family Medicine

## 2019-08-19 ENCOUNTER — Other Ambulatory Visit: Payer: Self-pay

## 2019-08-19 DIAGNOSIS — M25562 Pain in left knee: Secondary | ICD-10-CM

## 2019-08-19 DIAGNOSIS — M25552 Pain in left hip: Secondary | ICD-10-CM

## 2019-09-01 ENCOUNTER — Encounter: Payer: Medicaid Other | Admitting: Registered"

## 2019-09-01 DIAGNOSIS — F509 Eating disorder, unspecified: Secondary | ICD-10-CM

## 2019-09-01 DIAGNOSIS — F5081 Binge eating disorder: Secondary | ICD-10-CM | POA: Diagnosis not present

## 2019-09-01 NOTE — Progress Notes (Signed)
This visit was completed virtually due to the COVID-19 pandemic.   I spoke with Daine Gip and verified that I was speaking with the correct person with two patient identifiers (full name and date of birth).   I discussed the limitations related to this kind of visit and the patient is willing to proceed.  This note is not being shared with the patient for the following reason: To prevent harm (release of this note would result in harm to the life or physical safety of the patient or another).   Medical Nutrition Therapy:  Appt start time: 2:01  end time:  3:02  Therapist: Esperanza Heir  Assessment:  Primary concerns today: BED  Pt states she wakes up around in 12:30pm and has not had first meal for today yet (2 pm). States she wants to get back food journaling. Reports ADHD gets in the way of her planning and following through. States food gives her comfort and pleasure. Reports eating 2 meals + snacks yesterday. States she has been eating meals while watching tv although she is used to having meals at the kitchen table with family.   Uses escapism as a coping mechanism: Facebook/tv/M&Ms  24 hour recall:  B: none L: McDonald's-3 chicken nuggets + fries + sweet tea S: chocolate balls  D: Campbell's-1 can bean soup + 1/2 sleeve crackers S: 1/2 bag plain chips  Beverages: water, pop, coffee  Nutritional Diagnosis:  NB-1.5 Disordered eating pattern As related to meal skipping and binge eating.  As evidenced by dietary recall.    Intervention:  Nutrition counseling provided. Discussed meeting nutritional needs throughout the day with meals. Discussed having a meal prior to leaving home if that would create less anxiety of figuring out what to eat while out running errands. Encouraged pt to continue have breakfast to help meet needs.   Monitoring/Evaluation: will follow up in 2 weeks.

## 2019-09-08 ENCOUNTER — Encounter: Payer: BC Managed Care – PPO | Attending: Family Medicine | Admitting: Registered"

## 2019-09-08 DIAGNOSIS — F5081 Binge eating disorder: Secondary | ICD-10-CM | POA: Diagnosis not present

## 2019-09-08 DIAGNOSIS — F509 Eating disorder, unspecified: Secondary | ICD-10-CM | POA: Insufficient documentation

## 2019-09-08 NOTE — Progress Notes (Signed)
This visit was completed virtually due to the COVID-19 pandemic.   I spoke with Ashley Charles and verified that I was speaking with the correct person with two patient identifiers (full name and date of birth).   I discussed the limitations related to this kind of visit and the patient is willing to proceed.  This note is not being shared with the patient for the following reason: To prevent harm (release of this note would result in harm to the life or physical safety of the patient or another).   Medical Nutrition Therapy:  Appt start time: 2:02  end time:  2:59  Therapist: [name]  Assessment:  Primary concerns today: BED  Pt states she has been waking up earlier than usual since husband has started working again; wake u time around 7am. Feeling more tired during the day. Reports eating 2 meals + snacks yesterday and will likely have 3 meals today considering she has already had 2 meals prior to our 2pm appt today.   Uses escapism as a coping mechanism: Facebook/tv/M&Ms  24 hour recall:  B: skips or cereal + milk L: PBJ + clementine or perfect bar S: chips + M&Ms  D: spaghetti S:   Beverages: water, coffee  Nutritional Diagnosis:  NB-1.5 Disordered eating pattern As related to meal skipping and binge eating.  As evidenced by dietary recall.    Intervention:  Nutrition counseling provided. Discussed meeting nutritional needs throughout the day with meals. Encouraged pt to continue have breakfast to help meet needs along with lunch.   Monitoring/Evaluation: will follow up in 1 week.

## 2019-09-16 ENCOUNTER — Telehealth: Payer: Medicaid Other | Admitting: Registered"

## 2019-09-16 NOTE — Progress Notes (Deleted)
This visit was completed virtually due to the COVID-19 pandemic.   I spoke with Charlton Haws and verified that I was speaking with the correct person with two patient identifiers (full name and date of birth).   I discussed the limitations related to this kind of visit and the patient is willing to proceed.  This note is not being shared with the patient for the following reason: To prevent harm (release of this note would result in harm to the life or physical safety of the patient or another).   Medical Nutrition Therapy:  Appt start time: ***  end time: ***  Therapist: [name]  Assessment:  Primary concerns today: BED  Pt states she has been waking up earlier than usual since husband has started working again; wake u time around 7am. Feeling more tired during the day. Reports eating 2 meals + snacks yesterday and will likely have 3 meals today considering she has already had 2 meals prior to our 2pm appt today.   Uses escapism as a coping mechanism: Facebook/tv/M&Ms  24 hour recall:  B: skips or cereal + milk L: PBJ + clementine or perfect bar S: chips + M&Ms  D: spaghetti S:   Beverages: water, coffee  Nutritional Diagnosis:  NB-1.5 Disordered eating pattern As related to meal skipping and binge eating.  As evidenced by dietary recall.    Intervention:  Nutrition counseling provided. Discussed meeting nutritional needs throughout the day with meals. Encouraged pt to continue have breakfast to help meet needs along with lunch.   Monitoring/Evaluation: will follow up in 1 week.

## 2019-09-23 ENCOUNTER — Encounter: Payer: BC Managed Care – PPO | Admitting: Registered"

## 2019-09-23 DIAGNOSIS — F5081 Binge eating disorder: Secondary | ICD-10-CM | POA: Diagnosis not present

## 2019-09-23 DIAGNOSIS — F509 Eating disorder, unspecified: Secondary | ICD-10-CM

## 2019-09-23 NOTE — Progress Notes (Signed)
This visit was completed virtually due to the COVID-19 pandemic.   I spoke with Charlton Haws and verified that I was speaking with the correct person with two patient identifiers (full name and date of birth).   I discussed the limitations related to this kind of visit and the patient is willing to proceed.  This note is not being shared with the patient for the following reason: To prevent harm (release of this note would result in harm to the life or physical safety of the patient or another).   Medical Nutrition Therapy:  Appt start time: 2:08  end time: 2:58  Therapist: [name]  Assessment:  Primary concerns today: BED  Pt states she has been having 3 meals a day more often. States she has not had a midnight snack in the last 3 days. Reports she has run out of M&M's. States her sleeping patterns have been better and no longer sleeping as late in the mornings.   Uses escapism as a coping mechanism: Facebook/tv/M&Ms  24 hour recall:  B: Ensure  L: mini wheats + milk S:  D: beet soup S:   Beverages: water, coffee  Nutritional Diagnosis:  NB-1.5 Disordered eating pattern As related to meal skipping and binge eating.  As evidenced by dietary recall.    Intervention:  Nutrition counseling provided. Discussed meeting nutritional needs throughout the day with meals. Encouraged pt to continue have breakfast, lunch and dinner to help meet needs during the day.   Monitoring/Evaluation: will follow up in 1 week.

## 2019-09-25 DIAGNOSIS — F909 Attention-deficit hyperactivity disorder, unspecified type: Secondary | ICD-10-CM | POA: Diagnosis not present

## 2019-09-25 DIAGNOSIS — F509 Eating disorder, unspecified: Secondary | ICD-10-CM | POA: Diagnosis not present

## 2019-09-29 ENCOUNTER — Encounter: Payer: BC Managed Care – PPO | Admitting: Registered"

## 2019-09-29 DIAGNOSIS — F509 Eating disorder, unspecified: Secondary | ICD-10-CM | POA: Diagnosis not present

## 2019-09-29 DIAGNOSIS — F5081 Binge eating disorder: Secondary | ICD-10-CM | POA: Diagnosis not present

## 2019-09-29 NOTE — Progress Notes (Signed)
This visit was completed virtually due to the COVID-19 pandemic.   I spoke with Ashley Charles and verified that I was speaking with the correct person with two patient identifiers (full name and date of birth).   I discussed the limitations related to this kind of visit and the patient is willing to proceed.  This note is not being shared with the patient for the following reason: To prevent harm (release of this note would result in harm to the life or physical safety of the patient or another).   Medical Nutrition Therapy:  Appt start time: 2:03  end time: 3:00  Therapist: [name]  Assessment:  Primary concerns today: BED  Pt states she has been having 3 meals a day more often. States she has become reestablished with therapist. Reports cooking dinner last night and eating with family. States she also had breakfast yesterday but didn't have lunch due to having a late breakfast.   States she has new insurance and unsure if she will continue visits with me at [facility] due to finances. States she wants to but unsure of how it will affect her family financially. Will play it by ear.   Uses escapism as a coping mechanism: Facebook/tv/M&Ms  24 hour recall:  B: raisin bran + milk  S: L: sometimes skips or mini wheats + milk S:  D: pot roast + mashed potatoes S: handful of M&Ms  Beverages: water, coffee  Nutritional Diagnosis:  NB-1.5 Disordered eating pattern As related to meal skipping and binge eating.  As evidenced by dietary recall.    Intervention:  Nutrition counseling provided. Discussed meeting nutritional needs throughout the day with meals. Encouraged pt to continue have breakfast, lunch and dinner to help meet needs during the day.  Monitoring/Evaluation: will follow up in 1 week.

## 2019-10-02 DIAGNOSIS — F509 Eating disorder, unspecified: Secondary | ICD-10-CM | POA: Diagnosis not present

## 2019-10-02 DIAGNOSIS — F909 Attention-deficit hyperactivity disorder, unspecified type: Secondary | ICD-10-CM | POA: Diagnosis not present

## 2019-10-06 ENCOUNTER — Encounter: Payer: BC Managed Care – PPO | Admitting: Registered"

## 2019-10-07 DIAGNOSIS — M2550 Pain in unspecified joint: Secondary | ICD-10-CM | POA: Diagnosis not present

## 2019-10-07 DIAGNOSIS — L309 Dermatitis, unspecified: Secondary | ICD-10-CM | POA: Diagnosis not present

## 2019-10-07 DIAGNOSIS — G894 Chronic pain syndrome: Secondary | ICD-10-CM | POA: Diagnosis not present

## 2019-10-07 DIAGNOSIS — M1612 Unilateral primary osteoarthritis, left hip: Secondary | ICD-10-CM | POA: Diagnosis not present

## 2019-10-08 ENCOUNTER — Ambulatory Visit: Payer: BC Managed Care – PPO | Attending: Internal Medicine

## 2019-10-08 DIAGNOSIS — Z23 Encounter for immunization: Secondary | ICD-10-CM | POA: Insufficient documentation

## 2019-10-08 NOTE — Progress Notes (Signed)
   Covid-19 Vaccination Clinic  Name:  Ashley Charles    MRN: [id number] DOB: 1965-10-03  10/08/2019  [name] was observed post Covid-19 immunization for 15 minutes without incidence. She was provided with Vaccine Information Sheet and instruction to access the V-Safe system.   [name] was instructed to call 911 with any severe reactions post vaccine: Marland Kitchen Difficulty breathing  . Swelling of your face and throat  . A fast heartbeat  . A bad rash all over your body  . Dizziness and weakness    Immunizations Administered    Name Date Dose VIS Date Route   Pfizer COVID-19 Vaccine 10/08/2019 11:26 AM 0.3 mL 08/14/2019 Intramuscular   Manufacturer: [employer], [employer]   Lot: [id number]   NDC: [address]-7

## 2019-10-13 ENCOUNTER — Encounter: Payer: BC Managed Care – PPO | Attending: Family Medicine | Admitting: Registered"

## 2019-10-13 DIAGNOSIS — F5081 Binge eating disorder: Secondary | ICD-10-CM | POA: Insufficient documentation

## 2019-10-13 DIAGNOSIS — E119 Type 2 diabetes mellitus without complications: Secondary | ICD-10-CM

## 2019-10-13 DIAGNOSIS — F509 Eating disorder, unspecified: Secondary | ICD-10-CM | POA: Insufficient documentation

## 2019-10-13 NOTE — Progress Notes (Signed)
This visit was completed virtually due to the COVID-19 pandemic.   I spoke with Charlton Haws and verified that I was speaking with the correct person with two patient identifiers (full name and date of birth).   I discussed the limitations related to this kind of visit and the patient is willing to proceed.  This note is not being shared with the patient for the following reason: To prevent harm (release of this note would result in harm to the life or physical safety of the patient or another).   Medical Nutrition Therapy:  Appt start time: 2:03  end time: 3:00  Therapist: [name]  Assessment:  Primary concerns today: BED  Pt states she has been having 3 meals a day more often; reports 3 meals yesterday. 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 States she will get a second opinion about it and will also see a rheumatologist. States she is very concerned about appointments, being told she needs to lose weight, and generally hearing conversations about weight. States she may begin making smoothies with protein powder.   Uses escapism as a coping mechanism: Facebook/tv/M&Ms  24 hour recall:  B: oatmeal  S: L: ham and cheese sandwich + tortilla chips S:  D: bean soup + crackers S: handful of M&Ms  Beverages: water, coffee  Nutritional Diagnosis:  NB-1.5 Disordered eating pattern As related to meal skipping and binge eating.  As evidenced by dietary recall.    Intervention:  Nutrition counseling provided. Encouraged pt to continue have breakfast, lunch and dinner to help meet needs during the day. Discussed ways to have discussions with providers about weight and sharing history of eating disorder; support offered with this.   Monitoring/Evaluation: will follow up in 1 week.

## 2019-10-16 DIAGNOSIS — F909 Attention-deficit hyperactivity disorder, unspecified type: Secondary | ICD-10-CM | POA: Diagnosis not present

## 2019-10-16 DIAGNOSIS — F509 Eating disorder, unspecified: Secondary | ICD-10-CM | POA: Diagnosis not present

## 2019-10-22 ENCOUNTER — Telehealth: Payer: BC Managed Care – PPO | Admitting: Registered"

## 2019-10-26 ENCOUNTER — Other Ambulatory Visit: Payer: Self-pay | Admitting: Orthopedic Surgery

## 2019-10-26 DIAGNOSIS — M1612 Unilateral primary osteoarthritis, left hip: Secondary | ICD-10-CM

## 2019-10-28 ENCOUNTER — Ambulatory Visit: Payer: BC Managed Care – PPO | Admitting: Registered"

## 2019-10-28 DIAGNOSIS — F509 Eating disorder, unspecified: Secondary | ICD-10-CM | POA: Diagnosis not present

## 2019-10-28 DIAGNOSIS — F5081 Binge eating disorder: Secondary | ICD-10-CM | POA: Diagnosis not present

## 2019-10-28 NOTE — Progress Notes (Signed)
This visit was completed virtually due to the COVID-19 pandemic.   I spoke with Ashley Charles and verified that I was speaking with the correct person with two patient identifiers (full name and date of birth).   I discussed the limitations related to this kind of visit and the patient is willing to proceed.  This note is not being shared with the patient for the following reason: To prevent harm (release of this note would result in harm to the life or physical safety of the patient or another).   Medical Nutrition Therapy:  Appt start time: 2:06  end time: 3:00  Therapist: [name]  Assessment:  Primary concerns today: BED  Pt state she was denied hip surgery due to having BMI greater than 40. Reports BMI is 42. Reports having a challenging time with this decision. States she did not want to eat after her appointment because she was so discouraged. Plans to get another opinion.   Reports having 3 meals yesterday.  Uses escapism as a coping mechanism: Facebook/tv/M&Ms  24 hour recall:  B: oatmeal  S: L: sandwich (deli meat + cheese) + chips S:  D: rice + chicken + refried beans + chips S: handful of M&Ms  Beverages: water  Nutritional Diagnosis:  NB-1.5 Disordered eating pattern As related to meal skipping and binge eating.  As evidenced by dietary recall.    Intervention:  Nutrition counseling provided. Encouraged pt to continue have breakfast, lunch and dinner to help meet needs during the day.    Monitoring/Evaluation: will follow up in 1 week.

## 2019-10-30 ENCOUNTER — Other Ambulatory Visit: Payer: BC Managed Care – PPO

## 2019-10-30 DIAGNOSIS — F909 Attention-deficit hyperactivity disorder, unspecified type: Secondary | ICD-10-CM | POA: Diagnosis not present

## 2019-10-30 DIAGNOSIS — F509 Eating disorder, unspecified: Secondary | ICD-10-CM | POA: Diagnosis not present

## 2019-11-02 ENCOUNTER — Ambulatory Visit: Payer: BC Managed Care – PPO | Attending: Internal Medicine

## 2019-11-02 DIAGNOSIS — Z23 Encounter for immunization: Secondary | ICD-10-CM | POA: Insufficient documentation

## 2019-11-02 NOTE — Progress Notes (Signed)
   Covid-19 Vaccination Clinic  Name:  Ashley Charles    MRN: [id number] DOB: 1966/04/15  11/02/2019  [name] was observed post Covid-19 immunization for 15 minutes without incidence. She was provided with Vaccine Information Sheet and instruction to access the V-Safe system.   [name] was instructed to call 911 with any severe reactions post vaccine: Marland Kitchen Difficulty breathing  . Swelling of your face and throat  . A fast heartbeat  . A bad rash all over your body  . Dizziness and weakness    Immunizations Administered    Name Date Dose VIS Date Route   Pfizer COVID-19 Vaccine 11/02/2019 11:15 AM 0.3 mL 08/14/2019 Intramuscular   Manufacturer: [employer], [employer]   Lot: [id number]   NDC: [address]-3

## 2019-11-03 ENCOUNTER — Encounter: Payer: BC Managed Care – PPO | Attending: Family Medicine | Admitting: Registered"

## 2019-11-03 DIAGNOSIS — F5081 Binge eating disorder: Secondary | ICD-10-CM | POA: Diagnosis not present

## 2019-11-03 DIAGNOSIS — F509 Eating disorder, unspecified: Secondary | ICD-10-CM

## 2019-11-03 NOTE — Progress Notes (Signed)
This visit was completed virtually due to the COVID-19 pandemic.   I spoke with Ashley Charles and verified that I was speaking with the correct person with two patient identifiers (full name and date of birth).   I discussed the limitations related to this kind of visit and the patient is willing to proceed.  This note is not being shared with the patient for the following reason: To prevent harm (release of this note would result in harm to the life or physical safety of the patient or another).   Medical Nutrition Therapy:  Appt start time: 3:20  end time: 4:17  Therapist: [name]  Assessment:  Primary concerns today: BED  Pt states she has been having 3 meals/day most days. Reports still processing being denied by surgeon for having BMI greater than 40. States she felt like all of her needs were addressed. States she is in pain. Will be getting cortisone shot next week. Plans to start physical therapy in about 2 weeks once she has had the 2nd shot of COVID vaccine in her system for 2 weeks. States she is working on self-compassion.   Next appt: discuss book Self-Compassion with her  Uses escapism as a coping mechanism: Facebook/tv/M&Ms  24 hour recall:  B: oatmeal  S: L: [employer] - chicken sandwich + some fries S:  D: skipped (asleep) S: handful of M&Ms  Beverages: water only  Nutritional Diagnosis:  NB-1.5 Disordered eating pattern As related to meal skipping and binge eating.  As evidenced by dietary recall.    Intervention:  Nutrition counseling provided. Mainly listened and encouraged pt to have meals/snacks throughout the day to help meet needs.    Monitoring/Evaluation: will follow up in 1 week.

## 2019-11-09 ENCOUNTER — Ambulatory Visit
Admission: RE | Admit: 2019-11-09 | Discharge: 2019-11-09 | Disposition: A | Discharge status: Home / Self Care | Payer: BC Managed Care – PPO | Source: Ambulatory Visit | Attending: Orthopedic Surgery | Admitting: Orthopedic Surgery

## 2019-11-09 ENCOUNTER — Other Ambulatory Visit: Payer: Self-pay

## 2019-11-09 DIAGNOSIS — M1612 Unilateral primary osteoarthritis, left hip: Secondary | ICD-10-CM | POA: Diagnosis not present

## 2019-11-09 MED ORDER — METHYLPREDNISOLONE ACETATE 40 MG/ML INJ SUSP (RADIOLOG
120.0000 mg | Freq: Once | INTRAMUSCULAR | Status: AC
  Administered 2019-11-09: 120 mg via INTRA_ARTICULAR

## 2019-11-09 MED ORDER — IOPAMIDOL (ISOVUE-M 200) INJECTION 41%
1.0000 mL | Freq: Once | INTRAMUSCULAR | Status: AC
  Administered 2019-11-09: 1 mL via INTRA_ARTICULAR

## 2019-11-10 ENCOUNTER — Ambulatory Visit: Payer: BC Managed Care – PPO | Admitting: Registered"

## 2019-11-10 ENCOUNTER — Encounter: Payer: Self-pay | Admitting: Registered"

## 2019-11-10 DIAGNOSIS — F5081 Binge eating disorder: Secondary | ICD-10-CM | POA: Diagnosis not present

## 2019-11-10 DIAGNOSIS — F509 Eating disorder, unspecified: Secondary | ICD-10-CM | POA: Diagnosis not present

## 2019-11-10 NOTE — Patient Instructions (Signed)
-   Read Self-Compassion Chapter 1.

## 2019-11-10 NOTE — Progress Notes (Signed)
This visit was completed virtually due to the COVID-19 pandemic.   I spoke with Charlton Haws and verified that I was speaking with the correct person with two patient identifiers (full name and date of birth).   I discussed the limitations related to this kind of visit and the patient is willing to proceed.  This note is not being shared with the patient for the following reason: To prevent harm (release of this note would result in harm to the life or physical safety of the patient or another).   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 Received steroid shot in appt.  Starts physical therapy next week. States she was recommended hip replacement. States she ordered a  [place] go to help with endurance, strengthening legs, to help her to feel better, increase blood flow, and to get her joints moving. Still having 3 meals/day especially on weekdays.   Uses escapism as a coping mechanism: Facebook/tv/M&Ms  24 hour recall:  B: oatmeal  S: L: [employer] - chicken sandwich + some fries S: pringles  D: [place] [employer]-7" club sub (lettuce, tomato, mayo) S: handful of M&Ms  Beverages: water only  Nutritional Diagnosis:  NB-1.5 Disordered eating pattern As related to meal skipping and binge eating.  As evidenced by dietary recall.    Intervention: Nutrition counseling provided. Discussed reading the book Self-Compassion together. Encouraged pt to continue doing the work to take care of her body, eating 3 meals a day, and getting other opinions related to hip.    Monitoring/Evaluation: will follow up in 1 week.

## 2019-11-13 DIAGNOSIS — F509 Eating disorder, unspecified: Secondary | ICD-10-CM | POA: Diagnosis not present

## 2019-11-13 DIAGNOSIS — F909 Attention-deficit hyperactivity disorder, unspecified type: Secondary | ICD-10-CM | POA: Diagnosis not present

## 2019-11-17 ENCOUNTER — Encounter: Payer: BC Managed Care – PPO | Admitting: Registered"

## 2019-11-17 ENCOUNTER — Encounter: Payer: Self-pay | Admitting: Registered"

## 2019-11-17 DIAGNOSIS — F509 Eating disorder, unspecified: Secondary | ICD-10-CM | POA: Diagnosis not present

## 2019-11-17 DIAGNOSIS — F5081 Binge eating disorder: Secondary | ICD-10-CM | POA: Diagnosis not present

## 2019-11-17 NOTE — Progress Notes (Signed)
This visit was completed virtually due to the COVID-19 pandemic.   I spoke with Ashley Charles and verified that I was speaking with the correct person with two patient identifiers (full name and date of birth).   I discussed the limitations related to this kind of visit and the patient is willing to proceed.  This note is not being shared with the patient for the following reason: To prevent harm (release of this note would result in harm to the life or physical safety of the patient or another).   Medical Nutrition Therapy:  Appt start time: 3:30  end time: 4:00  Therapist: [name]  Assessment:  Primary concerns today: BED  Pt states she walked today without her cane. States she has received [place] and enjoying it as option for movement. States she will begin PT on Thurs, 3/18. States it has been tough processing thoughts related to husband's thoughts around pt's limitations at home. Reports not eating 3 meals recently.   Uses escapism as a coping mechanism: Facebook/tv/M&Ms  24 hour recall:  B: oatmeal or M&Ms S: L: frito's or Chicfil[employer] - chicken sandwich + some fries S: pringles  D: chicken nuggets + macaroni and cheese S: handful of M&Ms  Beverages: water only  Nutritional Diagnosis:  NB-1.5 Disordered eating pattern As related to meal skipping and binge eating.  As evidenced by dietary recall.    Intervention: Nutrition counseling provided. Discussed reading the book Self-Compassion together, will read one chapter per week. Encouraged pt to continue doing the work to take care of her body and eating 3 meals a day.    Monitoring/Evaluation: will follow up in 1 week.

## 2019-11-25 ENCOUNTER — Encounter: Payer: Self-pay | Admitting: Registered"

## 2019-11-25 ENCOUNTER — Ambulatory Visit: Payer: BC Managed Care – PPO | Admitting: Registered"

## 2019-11-25 DIAGNOSIS — Z713 Dietary counseling and surveillance: Secondary | ICD-10-CM

## 2019-11-25 DIAGNOSIS — F5081 Binge eating disorder: Secondary | ICD-10-CM | POA: Diagnosis not present

## 2019-11-25 DIAGNOSIS — F509 Eating disorder, unspecified: Secondary | ICD-10-CM | POA: Diagnosis not present

## 2019-11-25 NOTE — Progress Notes (Signed)
This visit was completed virtually due to the COVID-19 pandemic.   I spoke with Ashley Charles and verified that I was speaking with the correct person with two patient identifiers (full name and date of birth).   I discussed the limitations related to this kind of visit and the patient is willing to proceed.  This note is not being shared with the patient for the following reason: To prevent harm (release of this note would result in harm to the life or physical safety of the patient or another).   Medical Nutrition Therapy:  Appt start time: 3:30  end time: 4:33  Therapist: Esperanza Heir  Assessment:  Primary concerns today: BED  Pt states she has been stressed with taking care of mom. States she recently had Asian cuisine for husband's birthday the other day: Hibachi chicken + vegetables + fried rice. Reports concerns with son (on autism spectrum, ADHD, increased constipation) has pattern of making his own dinner. States going into the kitchen is overwhelming for her and cooking is discouraging because it hurts her feelings that he doesn't eat her food. States people don't like what she cooks sometimes. Pt reports providing for for 7 people currently. Has not had 3 meals recently because she wasn't hungry. Reports looking for a stool to help with prepping food and making it easier for her.   Also reports mom weighs daily due to history of CHF. Pt states she doesn't check it daily but sometimes this is triggering for her.   Previous appt: States she has received [place] and enjoying it as option for movement. States she will begin PT on Thurs, 3/18. States it has been tough processing thoughts related to husband's thoughts around pt's limitations at home.    Uses escapism as a coping mechanism: Facebook/tv/M&Ms  24 hour recall:  B: oatmeal or M&Ms S: L (3 pm): Chic[employer] - chicken sandwich + mac and cheese + a little sweet tea S:  D:  S: handful of M&Ms  Beverages: water  only  Nutritional Diagnosis:  NB-1.5 Disordered eating pattern As related to meal skipping and binge eating.  As evidenced by dietary recall.    Intervention: Nutrition counseling provided. Discussed barriers to prepping meals, meal components, and eating to nourish body. Encouraged pt to continue doing the work to take care of her body and eating 3 meals a day.   Monitoring/Evaluation: will follow up in 1 week.

## 2019-11-30 DIAGNOSIS — M25552 Pain in left hip: Secondary | ICD-10-CM | POA: Diagnosis not present

## 2019-12-01 DIAGNOSIS — M1612 Unilateral primary osteoarthritis, left hip: Secondary | ICD-10-CM | POA: Diagnosis not present

## 2019-12-01 DIAGNOSIS — Z13220 Encounter for screening for lipoid disorders: Secondary | ICD-10-CM | POA: Diagnosis not present

## 2019-12-01 DIAGNOSIS — Z79899 Other long term (current) drug therapy: Secondary | ICD-10-CM | POA: Diagnosis not present

## 2019-12-01 DIAGNOSIS — R7303 Prediabetes: Secondary | ICD-10-CM | POA: Diagnosis not present

## 2019-12-01 DIAGNOSIS — E039 Hypothyroidism, unspecified: Secondary | ICD-10-CM | POA: Diagnosis not present

## 2019-12-01 DIAGNOSIS — G894 Chronic pain syndrome: Secondary | ICD-10-CM | POA: Diagnosis not present

## 2019-12-02 ENCOUNTER — Encounter: Payer: BC Managed Care – PPO | Admitting: Registered"

## 2019-12-02 ENCOUNTER — Encounter: Payer: Self-pay | Admitting: Registered"

## 2019-12-02 DIAGNOSIS — F5081 Binge eating disorder: Secondary | ICD-10-CM | POA: Diagnosis not present

## 2019-12-02 DIAGNOSIS — F509 Eating disorder, unspecified: Secondary | ICD-10-CM | POA: Diagnosis not present

## 2019-12-02 DIAGNOSIS — Z713 Dietary counseling and surveillance: Secondary | ICD-10-CM

## 2019-12-02 DIAGNOSIS — M25552 Pain in left hip: Secondary | ICD-10-CM | POA: Diagnosis not present

## 2019-12-02 NOTE — Progress Notes (Signed)
This visit was completed virtually due to the COVID-19 pandemic.   I spoke with Ashley Charles and verified that I was speaking with the correct person with two patient identifiers (full name and date of birth).   I discussed the limitations related to this kind of visit and the patient is willing to proceed.  This note is not being shared with the patient for the following reason: To prevent harm (release of this note would result in harm to the life or physical safety of the patient or another).   Medical Nutrition Therapy:  Appt start time: 5:00  end time: 5:38  Therapist: [name]  Assessment:  Primary concerns today: BED  States she has been working on having 3 meals a day. Reports having 3 meals/day about 3 times since previous visit. States she has been going to [facility] and they work well with people in larger bodies. Pt state she has an upcoming appt with new surgeon for potential hip surgery 4/5. States recent lab work revealed elevated A1c 5.7 which is a decrease from the previous visit. States she felt uncomfortable having her weight taken because didn't feel the need for it. States she felt pressured into having weight taken by being asked multiple times.   Previous appt: States she has received [place] and enjoying it as option for movement. States she will begin PT on Thurs, 3/18. States it has been tough processing thoughts related to husband's thoughts around pt's limitations at home. Reports concerns with son (on autism spectrum, ADHD, increased constipation) has pattern of making his own dinner. States going into the kitchen is overwhelming for her and cooking is discouraging because it hurts her feelings that he doesn't eat her food. States people don't like what she cooks sometimes. Pt reports providing for for 7 people currently. Reports looking for a stool to help with prepping food and making it easier for her. Also reports mom weighs daily due to  history of CHF. Pt states she doesn't check it daily but sometimes this is triggering for her.  Uses escapism as a coping mechanism: Facebook/tv/M&Ms  24 hour recall:  B: oatmeal +  milk S: L (3 pm): McDonald's-fish sandwich  S:  D: chicken and rice  S: handful of M&Ms + ice cream cookie sandwich  Beverages: water only  Nutritional Diagnosis:  NB-1.5 Disordered eating pattern As related to meal skipping and binge eating.  As evidenced by dietary recall.    Intervention: Nutrition counseling provided. Discussed barriers to prepping meals, meal components, and eating to nourish body. Encouraged pt to continue doing the work to take care of her body and eating 3 meals a day.   Monitoring/Evaluation: will follow up in 2 weeks.

## 2019-12-07 ENCOUNTER — Other Ambulatory Visit: Payer: Self-pay

## 2019-12-07 ENCOUNTER — Ambulatory Visit (INDEPENDENT_AMBULATORY_CARE_PROVIDER_SITE_OTHER): Payer: BC Managed Care – PPO | Admitting: Orthopaedic Surgery

## 2019-12-07 VITALS — Ht 67.0 in | Wt 388.0 lb

## 2019-12-07 DIAGNOSIS — M1612 Unilateral primary osteoarthritis, left hip: Secondary | ICD-10-CM | POA: Insufficient documentation

## 2019-12-07 DIAGNOSIS — M25552 Pain in left hip: Secondary | ICD-10-CM

## 2019-12-07 NOTE — Progress Notes (Signed)
Office Visit Note   Patient: Ashley Charles           Date of Birth: [birth year]           MRN: [id number] Visit Date: 12/07/2019              Requested by: Sigmund Hazel, MD [address] [place],  [place] [address] PCP: Sigmund Hazel, MD   Assessment & Plan: Visit Diagnoses:  1. Unilateral primary osteoarthritis, left hip   2. Pain in left hip     Plan: The patient does have severe end-stage arthritis of her left hip.  I have certainly recommended weight loss for her and have also recommended her reach out to a general surgeon such as Dr. Gaynelle Adu who performs bariatric surgery to start that conversation about the potential for other modalities that can help for her with weight loss.  I did have her lay in a supine position to see if I could appropriately get to her hip given her weight and I do feel comfortable that the soft tissue planes do allow me to get to her hip.  With that being said, I would like to reevaluate her in 3 months after course of physical therapy and attempts at weight loss.  At that visit I would like her weight again with a BMI calculated.  All question concerns were answered and addressed.  Follow-Up Instructions: Return in about 3 months (around 03/07/2020).   Orders:  No orders of the defined types were placed in this encounter.  No orders of the defined types were placed in this encounter.     Procedures: No procedures performed   Clinical Data: No additional findings.   Subjective: Chief Complaint  Patient presents with  . Left Hip - Pain  The patient is a very pleasant 54 year old female who comes to me for evaluation treatment of severe debilitating arthritis involving her left hip.  She has seen another orthopedic surgeon for this before.  Unfortunately her weight is 388 pounds with a BMI of 60.  She is now in physical therapy to strengthen the muscles around her left hip.  She also has had intra-articular steroid injection which is  certainly helping decrease some of her pain.  X-rays are on the canopy system for me to review.  She is not a diabetic.  She does ambulate with a cane in her opposite hand.  Her husband is with her today as well.  At this point her pain is daily and it is detrimentally affecting her activities of daily living, her quality of life and her mobility.  This is been going on for well over a year.  She is also been unable to lose weight.  HPI  Review of Systems She currently denies any headache, chest pain, shortness of breath, fever, chills, nausea, vomiting  Objective: Vital Signs: Ht 5\' 7"  (1.702 m)   Wt (!) 388 lb (176 kg)   BMI 60.77 kg/m   Physical Exam She is alert and orient x3 and in no acute distress Ortho Exam Examination of her left hip shows severe pain with attempts of internal and external rotation.  This pain is mainly in the groin.  Her right hip exam is normal. Specialty Comments:  No specialty comments available.  Imaging: No results found. X-rays on the canopy system show the left hip.  There is severe end-stage arthritic changes left hip.  There may be a component of osteonecrosis as well.  There is no  superior lateral joint space left.  There is cystic changes as well.  PMFS History: Patient Active Problem List   Diagnosis Date Noted  . Unilateral primary osteoarthritis, left hip 12/07/2019   Past Medical History:  Diagnosis Date  . Sleep apnea     Family History  Problem Relation Age of Onset  . Anxiety disorder Mother   . Stroke Mother   . Congestive Heart Failure Mother   . Atrial fibrillation Mother   . Hypertension Father   . Depression Father   . CVA Father   . Anxiety disorder Brother   . Diabetes Brother   . Hypertension Brother     Past Surgical History:  Procedure Laterality Date  . CESAREAN SECTION    . FOOT SURGERY    . TONSILLECTOMY     Social History   Occupational History  . Not on file  Tobacco Use  . Smoking status: Not on file   Substance and Sexual Activity  . Alcohol use: Not on file  . Drug use: Not on file  . Sexual activity: Not on file

## 2019-12-08 ENCOUNTER — Encounter: Payer: Self-pay | Admitting: Orthopaedic Surgery

## 2019-12-08 DIAGNOSIS — F900 Attention-deficit hyperactivity disorder, predominantly inattentive type: Secondary | ICD-10-CM | POA: Diagnosis not present

## 2019-12-08 DIAGNOSIS — F322 Major depressive disorder, single episode, severe without psychotic features: Secondary | ICD-10-CM | POA: Diagnosis not present

## 2019-12-08 DIAGNOSIS — F411 Generalized anxiety disorder: Secondary | ICD-10-CM | POA: Diagnosis not present

## 2019-12-08 NOTE — Telephone Encounter (Signed)
Back to you

## 2019-12-09 DIAGNOSIS — M25552 Pain in left hip: Secondary | ICD-10-CM | POA: Diagnosis not present

## 2019-12-11 DIAGNOSIS — F509 Eating disorder, unspecified: Secondary | ICD-10-CM | POA: Diagnosis not present

## 2019-12-11 DIAGNOSIS — M25552 Pain in left hip: Secondary | ICD-10-CM | POA: Diagnosis not present

## 2019-12-11 DIAGNOSIS — F909 Attention-deficit hyperactivity disorder, unspecified type: Secondary | ICD-10-CM | POA: Diagnosis not present

## 2019-12-15 DIAGNOSIS — M25552 Pain in left hip: Secondary | ICD-10-CM | POA: Diagnosis not present

## 2019-12-17 ENCOUNTER — Encounter: Payer: BC Managed Care – PPO | Attending: Registered" | Admitting: Registered"

## 2019-12-17 ENCOUNTER — Telehealth: Payer: BC Managed Care – PPO | Admitting: Registered"

## 2019-12-17 ENCOUNTER — Ambulatory Visit: Payer: BC Managed Care – PPO | Admitting: Registered"

## 2019-12-17 ENCOUNTER — Encounter: Payer: Self-pay | Admitting: Orthopaedic Surgery

## 2019-12-17 ENCOUNTER — Encounter: Payer: Self-pay | Admitting: Registered"

## 2019-12-17 DIAGNOSIS — M25552 Pain in left hip: Secondary | ICD-10-CM | POA: Diagnosis not present

## 2019-12-17 DIAGNOSIS — Z713 Dietary counseling and surveillance: Secondary | ICD-10-CM | POA: Diagnosis not present

## 2019-12-17 NOTE — Progress Notes (Signed)
This visit was completed virtually due to the COVID-19 pandemic.   I spoke with Charlton Haws and verified that I was speaking with the correct person with two patient identifiers (full name and date of birth).   I discussed the limitations related to this kind of visit and the patient is willing to proceed.  This note is not being shared with the patient for the following reason: To prevent harm (release of this note would result in harm to the life or physical safety of the patient or another).   Medical Nutrition Therapy:  Appt start time: 11:05  end time: 12:02  Therapist: [name]  Assessment:  Primary concerns today: BED  Recent lab results from 12/01/19: elevated A1c 5.7, elevated Chol 207; Trg, HDL, Vit D, TSH, and CMP all within normal limits.   States she has not been having 3 meals a day lately. States kitchen needs to be cleaned and it is overwhelming  for her to walk in there, cook, and eat. States she has not been strong with having breakfast this week but likes the oatmeal option she was having. States she was having someone bring her the water to make instant oatmeal because it was challenging for her to go up and down stairs to make it herself. States she will have deep cleaning company come to her home and clean because it is too much for her.   Previous appts: States she has received [place] and enjoying it as option for movement. States she will begin PT on Thurs, 3/18. States it has been tough processing thoughts related to husband's thoughts around pt's limitations at home. Reports concerns with son (on autism spectrum, ADHD, increased constipation) has pattern of making his own dinner. States going into the kitchen is overwhelming for her and cooking is discouraging because it hurts her feelings that he doesn't eat her food. States people don't like what she cooks sometimes. Pt reports providing for for 7 people currently. Reports looking for a stool to help with prepping  food and making it easier for her. Also reports mom weighs daily due to history of CHF. Pt states she doesn't check it daily but sometimes this is triggering for her.  Uses escapism as a coping mechanism: Facebook/tv/M&Ms  24 hour recall:  B: skipped S: L: apple + PB S:  D: bean and bacon soup + saltine crackers  S: peanut M&Ms   Beverages: water only  Nutritional Diagnosis:  NB-1.5 Disordered eating pattern As related to meal skipping and binge eating.  As evidenced by dietary recall.    Intervention: Nutrition counseling provided. Discussed having breakfast again, correlation between being adequately nourished and fatigue/brain fog, and having company clean kitchen to benefit her. Discussed having conversations with therapists for self and children related to home life, delegating responsibilities/chores, etc.    Goals: - Incorporate breakfast again. Can be oatmeal.   Monitoring/Evaluation: will follow up in 2 weeks.

## 2019-12-17 NOTE — Patient Instructions (Signed)
-   Incorporate breakfast again. Can be oatmeal.

## 2019-12-22 DIAGNOSIS — M25552 Pain in left hip: Secondary | ICD-10-CM | POA: Diagnosis not present

## 2019-12-24 DIAGNOSIS — M25552 Pain in left hip: Secondary | ICD-10-CM | POA: Diagnosis not present

## 2019-12-28 DIAGNOSIS — M25552 Pain in left hip: Secondary | ICD-10-CM | POA: Diagnosis not present

## 2019-12-29 ENCOUNTER — Ambulatory Visit: Payer: BC Managed Care – PPO | Admitting: Registered"

## 2019-12-29 ENCOUNTER — Encounter: Payer: BC Managed Care – PPO | Admitting: Registered"

## 2019-12-29 DIAGNOSIS — Z713 Dietary counseling and surveillance: Secondary | ICD-10-CM

## 2019-12-29 NOTE — Progress Notes (Signed)
This visit was completed virtually due to the COVID-19 pandemic.   I spoke with Charlton Haws and verified that I was speaking with the correct person with two patient identifiers (full name and date of birth).   I discussed the limitations related to this kind of visit and the patient is willing to proceed.  This note is not being shared with the patient for the following reason: To prevent harm (release of this note would result in harm to the life or physical safety of the patient or another).   Medical Nutrition Therapy:  Appt start time: 3:10  end time: 4:02  Therapist: [name]  Assessment:  Primary concerns today: BED  Recent lab results from 12/01/19: elevated A1c 5.7, elevated Chol 207; Trg, HDL, Vit D, TSH, and CMP all within normal limits.   States she has been cleaning up more. Has new device that allows her to sit and while cleaning at the same time. States she feels so estranged from her kitchen. Reports she needs help with boundaries and having time to herself. Reports she is unable to take a break and its hard being needed by everyone all of the time. Informed client to discuss these concerns in therapy. States she has been using arm bike while at physical therapy 2x/week and it has been helpful. Has not used [employer] in 2 weeks because it is downstairs.   Previous appts: States she has received [place] and enjoying it as option for movement. States she will begin PT on Thurs, 3/18. States it has been tough processing thoughts related to husband's thoughts around pt's limitations at home. Reports concerns with son (on autism spectrum, ADHD, increased constipation) has pattern of making his own dinner. States going into the kitchen is overwhelming for her and cooking is discouraging because it hurts her feelings that he doesn't eat her food. States people don't like what she cooks sometimes. Pt reports providing for for 7 people currently. Reports looking for a stool to help with  prepping food and making it easier for her. Also reports mom weighs daily due to history of CHF. Pt states she doesn't check it daily but sometimes this is triggering for her.  Uses escapism as a coping mechanism: Facebook/tv/M&Ms  24 hour recall:  B: skipped S: L: apple + PB S:  D: bean and bacon soup + saltine crackers  S: peanut M&Ms   Beverages: water only  Physical activity: arm bike 20 min, 2x/day  Nutritional Diagnosis:  NB-1.5 Disordered eating pattern As related to meal skipping and binge eating.  As evidenced by dietary recall.    Intervention: Nutrition counseling provided. Mainly listened. Discussed having conversations with therapists for self and children related to home life, delegating responsibilities/chores, etc. Discussed joyful movement options and benefits of being active.   Monitoring/Evaluation: will follow up in 1 week.

## 2020-01-04 DIAGNOSIS — M25552 Pain in left hip: Secondary | ICD-10-CM | POA: Diagnosis not present

## 2020-01-05 ENCOUNTER — Ambulatory Visit: Payer: BC Managed Care – PPO | Admitting: Registered"

## 2020-01-05 ENCOUNTER — Encounter: Payer: BC Managed Care – PPO | Attending: Family Medicine | Admitting: Registered"

## 2020-01-05 DIAGNOSIS — Z713 Dietary counseling and surveillance: Secondary | ICD-10-CM | POA: Insufficient documentation

## 2020-01-07 DIAGNOSIS — M25552 Pain in left hip: Secondary | ICD-10-CM | POA: Diagnosis not present

## 2020-01-12 ENCOUNTER — Encounter: Payer: BC Managed Care – PPO | Admitting: Registered"

## 2020-01-12 ENCOUNTER — Encounter: Payer: Self-pay | Admitting: Registered"

## 2020-01-12 DIAGNOSIS — Z713 Dietary counseling and surveillance: Secondary | ICD-10-CM

## 2020-01-12 NOTE — Patient Instructions (Addendum)
-   Try to serve steamable/microwaveable vegetables with dinner.

## 2020-01-12 NOTE — Progress Notes (Signed)
This visit was completed virtually due to the COVID-19 pandemic.   I spoke with Charlton Haws and verified that I was speaking with the correct person with two patient identifiers (full name and date of birth).   I discussed the limitations related to this kind of visit and the patient is willing to proceed.  This note is not being shared with the patient for the following reason: To prevent harm (release of this note would result in harm to the life or physical safety of the patient or another).   Medical Nutrition Therapy:  Appt start time: 3:10  end time: 4:05  Therapist: [name]; meets bi-weekly  Assessment:  Primary concerns today: BED  Recent lab results from 12/01/19: elevated A1c 5.7, elevated Chol 207; Trg, HDL, Vit D, TSH, and CMP all within normal limits.   States she is in a tough spot not being about to do what she wants related to traveling and flying on a plane. Pt states she is in a bad spot and wants to just  be with herself. States she has a lot going on in her home. Feels helpless in doing things around her house. States she has been talking through these things with therapist. Reports upcoming changes of 3 sons going away to college in the fall. States she has had issues in the past related to preparing meals and people were complaining with how she cooked things and what she cooked. States she was discouraged. Reports she wants to try cooking again.     Previous appts: States she has been using arm bike while at physical therapy 2x/week and it has been helpful. Has not used Cubii in 2 weeks because it is downstairs. States she has received [place] and enjoying it as option for movement. States she will begin PT on Thurs, 3/18. States it has been tough processing thoughts related to husband's thoughts around pt's limitations at home. Reports concerns with son (on autism spectrum, ADHD, increased constipation) has pattern of making his own dinner. States going into the  kitchen is overwhelming for her and cooking is discouraging because it hurts her feelings that he doesn't eat her food. States people don't like what she cooks sometimes. Pt reports providing for for 7 people currently. Reports looking for a stool to help with prepping food and making it easier for her. Also reports mom weighs daily due to history of CHF. Pt states she doesn't check it daily but sometimes this is triggering for her.  Uses escapism as a coping mechanism: Facebook/tv/M&Ms  24 hour recall:  B: skipped S: L: apple + PB S:  D: bean and bacon soup + saltine crackers  S: peanut M&Ms   Beverages: water only  Physical activity: arm bike 20 min, 2x/day  Nutritional Diagnosis:  NB-1.5 Disordered eating pattern As related to meal skipping and binge eating.  As evidenced by dietary recall.    Intervention: Nutrition counseling provided. Discussed continuing to have conversations with therapist related to home life, delegating responsibilities/chores, etc. Discussed having ways to practically prepare meals for family.  Goals: - Try to serve steamable/microwaveable vegetables with dinner.   Monitoring/Evaluation: will follow up in 1 week.

## 2020-01-13 DIAGNOSIS — M25552 Pain in left hip: Secondary | ICD-10-CM | POA: Diagnosis not present

## 2020-01-14 DIAGNOSIS — F509 Eating disorder, unspecified: Secondary | ICD-10-CM | POA: Diagnosis not present

## 2020-01-14 DIAGNOSIS — F909 Attention-deficit hyperactivity disorder, unspecified type: Secondary | ICD-10-CM | POA: Diagnosis not present

## 2020-01-19 DIAGNOSIS — M25552 Pain in left hip: Secondary | ICD-10-CM | POA: Diagnosis not present

## 2020-01-20 ENCOUNTER — Encounter: Payer: BC Managed Care – PPO | Admitting: Registered"

## 2020-01-20 DIAGNOSIS — Z713 Dietary counseling and surveillance: Secondary | ICD-10-CM

## 2020-01-20 DIAGNOSIS — F909 Attention-deficit hyperactivity disorder, unspecified type: Secondary | ICD-10-CM | POA: Diagnosis not present

## 2020-01-20 DIAGNOSIS — F509 Eating disorder, unspecified: Secondary | ICD-10-CM | POA: Diagnosis not present

## 2020-01-20 NOTE — Progress Notes (Signed)
This visit was completed virtually due to the COVID-19 pandemic.   I spoke with Ashley Charles and verified that I was speaking with the correct person with two patient identifiers (full name and date of birth).   I discussed the limitations related to this kind of visit and the patient is willing to proceed.  This note is not being shared with the patient for the following reason: To prevent harm (release of this note would result in harm to the life or physical safety of the patient or another).   Medical Nutrition Therapy:  Appt start time: 3:06  end time: 4:00  Therapist: [name]; meets bi-weekly  Assessment:  Primary concerns today: BED  Recent lab results from 12/01/19: elevated A1c 5.7, elevated Chol 207; Trg, HDL, Vit D, TSH, and CMP all within normal limits.   States she didn't work on vegetables. States she hasn't been able to work on changes to her routine since her previous routine. States she has bought items for making vegetables and thinks she will be making them soon.  States she has bought new items to bake as well. Likes to bake. Has not cooked yet. Planning to pick up protein options at grocery store. States she updated family about picking up rotisserie chicken during the week or other items to be warmed up/eaten at home and 6 year old son scoffed at the idea of him being the one who would pick it up daily. States there is hurdle for her and preparing meals. Concerns of how family will respond. If they will like it or not. Want to make their own food. Also uncertainty of how to plan meals. States she didn't grow up learning how to cook vegetables. Reports planning to go to outodoor pool this summer. Has ordered new swim wear.   Previous appts: States she has been using arm bike while at physical therapy 2x/week and it has been helpful. Has not used Cubii in 2 weeks because it is downstairs. States she has received [place] and enjoying it as option for movement. States she  will begin PT on Thurs, 3/18. States it has been tough processing thoughts related to husband's thoughts around pt's limitations at home. Reports concerns with son (on autism spectrum, ADHD, increased constipation) has pattern of making his own dinner. States going into the kitchen is overwhelming for her and cooking is discouraging because it hurts her feelings that he doesn't eat her food. States people don't like what she cooks sometimes. Pt reports providing for for 7 people currently. Reports looking for a stool to help with prepping food and making it easier for her. Also reports mom weighs daily due to history of CHF. Pt states she doesn't check it daily but sometimes this is triggering for her.  Uses escapism as a coping mechanism: Facebook/tv/M&Ms  24 hour recall:  B: cereal (Frosted Flakes) + milk + coffee S: L: popcorn + orange S:  D: [place] [employer] - club sub + chips S: peanut M&Ms   Beverages: water only  Physical activity: arm bike 20 min, 2x/day  Nutritional Diagnosis:  NB-1.5 Disordered eating pattern As related to meal skipping and binge eating.  As evidenced by dietary recall.    Intervention: Nutrition counseling provided. Discussed ways to prepare vegetables in the oven. Reviewed Bake, Broil, Grill handout. Encouraged pt with picking up protein items or easy items to cook at home for family. Pt created her own goal for this appt.  Goals: -  Will have at  least one meal with protein, vegetable, and starch/grain at the table.      Monitoring/Evaluation: will follow up in 1 week.

## 2020-01-21 DIAGNOSIS — M25552 Pain in left hip: Secondary | ICD-10-CM | POA: Diagnosis not present

## 2020-01-26 DIAGNOSIS — M25552 Pain in left hip: Secondary | ICD-10-CM | POA: Diagnosis not present

## 2020-02-02 DIAGNOSIS — M25552 Pain in left hip: Secondary | ICD-10-CM | POA: Diagnosis not present

## 2020-02-03 ENCOUNTER — Encounter: Payer: BC Managed Care – PPO | Attending: Family Medicine | Admitting: Registered"

## 2020-02-03 ENCOUNTER — Encounter: Payer: Self-pay | Admitting: Registered"

## 2020-02-03 DIAGNOSIS — F509 Eating disorder, unspecified: Secondary | ICD-10-CM | POA: Diagnosis not present

## 2020-02-03 DIAGNOSIS — F5081 Binge eating disorder: Secondary | ICD-10-CM | POA: Diagnosis not present

## 2020-02-03 DIAGNOSIS — Z713 Dietary counseling and surveillance: Secondary | ICD-10-CM

## 2020-02-03 NOTE — Progress Notes (Signed)
This visit was completed virtually due to the COVID-19 pandemic.   I spoke with Charlton Haws and verified that I was speaking with the correct person with two patient identifiers (full name and date of birth).   I discussed the limitations related to this kind of visit and the patient is willing to proceed.  This note is not being shared with the patient for the following reason: To prevent harm (release of this note would result in harm to the life or physical safety of the patient or another).   Medical Nutrition Therapy:  Appt start time: 3:06  end time: 4:02  Therapist: [name]; meets bi-weekly  Assessment:  Primary concerns today: BED  Recent lab results from 12/01/19: elevated A1c 5.7, elevated Chol 207; Trg, HDL, Vit D, TSH, and CMP all within normal limits.   States it has been a hard week; rabbit died. Will discuss with therapist tomorrow during appt. States she plans to cook steaks and potatoes for tomorrow's dinner. States she had 3 meals yesterday. States she has a long way to go with healing relationship with food. States she has a bad relationship with food. States she gave directions to son and he prepared dinner. Reports wanting to move body more. Reports wanting to improve strength and stamina. Wants to be able to walk with son and husband sometimes. Reports being unable to sign up for local pool because they had reached their capacity.   Previous appts: States she has been using arm bike while at physical therapy 2x/week and it has been helpful. Has not used Cubii in 2 weeks because it is downstairs. States she will begin PT on Thurs, 3/18. States it has been tough processing thoughts related to husband's thoughts around pt's limitations at home. Reports concerns with son (on autism spectrum, ADHD, increased constipation) has pattern of making his own dinner. States going into the kitchen is overwhelming for her and cooking is discouraging because it hurts her feelings  that he doesn't eat her food. States people don't like what she cooks sometimes. Pt reports providing for for 7 people currently. Reports looking for a stool to help with prepping food and making it easier for her. Also reports mom weighs daily due to history of CHF. Pt states she doesn't check it daily but sometimes this is triggering for her.  Uses escapism as a coping mechanism: Facebook/tv/M&Ms  24 hour recall:  B: oatmeal S: L: [employer] - roast [place] gyro + a few curly fries S:  D: chicken + noodles in instapot S: peanut M&Ms + popcorn  Beverages: water only  Physical activity: arm bike 20 min, 2x/day  Nutritional Diagnosis:  NB-1.5 Disordered eating pattern As related to meal skipping and binge eating.  As evidenced by dietary recall.    Intervention: Nutrition counseling provided. Discussed celebrating victories along the way - eating 3 meals a day yesterday and preparing one of those meals. Discussed ways to introduce movement.    Monitoring/Evaluation: will follow up in 1 week.

## 2020-02-04 DIAGNOSIS — F909 Attention-deficit hyperactivity disorder, unspecified type: Secondary | ICD-10-CM | POA: Diagnosis not present

## 2020-02-04 DIAGNOSIS — F509 Eating disorder, unspecified: Secondary | ICD-10-CM | POA: Diagnosis not present

## 2020-02-10 ENCOUNTER — Ambulatory Visit: Payer: BC Managed Care – PPO | Admitting: Registered"

## 2020-02-10 ENCOUNTER — Encounter: Payer: BC Managed Care – PPO | Admitting: Registered"

## 2020-02-10 ENCOUNTER — Encounter: Payer: Self-pay | Admitting: Registered"

## 2020-02-10 DIAGNOSIS — F509 Eating disorder, unspecified: Secondary | ICD-10-CM | POA: Diagnosis not present

## 2020-02-10 DIAGNOSIS — Z713 Dietary counseling and surveillance: Secondary | ICD-10-CM

## 2020-02-10 DIAGNOSIS — F5081 Binge eating disorder: Secondary | ICD-10-CM | POA: Diagnosis not present

## 2020-02-10 NOTE — Progress Notes (Signed)
This visit was completed virtually due to the COVID-19 pandemic.   I spoke with Ashley Charles and verified that I was speaking with the correct person with two patient identifiers (full name and date of birth).   I discussed the limitations related to this kind of visit and the patient is willing to proceed.  This note is not being shared with the patient for the following reason: To prevent harm (release of this note would result in harm to the life or physical safety of the patient or another).   Medical Nutrition Therapy:  Appt start time: 3:02  end time: 4:00  Therapist: [name]; meets bi-weekly  Assessment:  Primary concerns today: BED  Recent lab results from 12/01/19: elevated A1c 5.7, elevated Chol 207; Trg, HDL, Vit D, TSH, and CMP all within normal limits.   States she and family had steak and potatoes one day last week. States she needs to get in kitchen more. States she needs more self-compassion and wants to be more active but does not know how. Expresses concern about not receiving hip replacement and what that means for her. Reports being unable to sign up for local pool because they had reached their capacity.   Previous appts: Reports being unable to sign up for local pool because they had reached their capacity. States she has been using arm bike while at physical therapy 2x/week and it has been helpful. Has not used Cubii in 2 weeks because it is downstairs. States she will begin PT on Thurs, 3/18. States it has been tough processing thoughts related to husband's thoughts around pt's limitations at home. Reports concerns with son (on autism spectrum, ADHD, increased constipation) has pattern of making his own dinner. States going into the kitchen is overwhelming for her and cooking is discouraging because it hurts her feelings that he doesn't eat her food. States people don't like what she cooks sometimes. Pt reports providing for for 7 people currently. Reports looking  for a stool to help with prepping food and making it easier for her. Also reports mom weighs daily due to history of CHF. Pt states she doesn't check it daily but sometimes this is triggering for her.  Uses escapism as a coping mechanism: Facebook/tv/M&Ms  24 hour recall:  B: skipped (still asleep, woke up late) S: L: eggs + potatoes S:  D: [employer] - [place], ham, bacon, lettuce, tomatoes sub + chips  S:   Beverages: water only  Physical activity: arm bike 20 min, 2x/day  Nutritional Diagnosis:  NB-1.5 Disordered eating pattern As related to meal skipping and binge eating.  As evidenced by dietary recall.    Intervention: Nutrition counseling provided. Discussed celebrating victories along the way - preparing meals at home. Discussed ways to introduce movement.    Monitoring/Evaluation: will follow up in 1 week.

## 2020-02-11 DIAGNOSIS — F509 Eating disorder, unspecified: Secondary | ICD-10-CM | POA: Diagnosis not present

## 2020-02-11 DIAGNOSIS — F909 Attention-deficit hyperactivity disorder, unspecified type: Secondary | ICD-10-CM | POA: Diagnosis not present

## 2020-02-12 DIAGNOSIS — M25552 Pain in left hip: Secondary | ICD-10-CM | POA: Diagnosis not present

## 2020-02-17 ENCOUNTER — Ambulatory Visit: Payer: BC Managed Care – PPO | Admitting: Registered"

## 2020-02-17 ENCOUNTER — Encounter: Payer: BC Managed Care – PPO | Admitting: Registered"

## 2020-02-18 DIAGNOSIS — F509 Eating disorder, unspecified: Secondary | ICD-10-CM | POA: Diagnosis not present

## 2020-02-18 DIAGNOSIS — F909 Attention-deficit hyperactivity disorder, unspecified type: Secondary | ICD-10-CM | POA: Diagnosis not present

## 2020-02-25 DIAGNOSIS — F509 Eating disorder, unspecified: Secondary | ICD-10-CM | POA: Diagnosis not present

## 2020-02-25 DIAGNOSIS — F909 Attention-deficit hyperactivity disorder, unspecified type: Secondary | ICD-10-CM | POA: Diagnosis not present

## 2020-03-02 ENCOUNTER — Encounter: Payer: BC Managed Care – PPO | Admitting: Registered"

## 2020-03-02 DIAGNOSIS — F411 Generalized anxiety disorder: Secondary | ICD-10-CM | POA: Diagnosis not present

## 2020-03-02 DIAGNOSIS — F900 Attention-deficit hyperactivity disorder, predominantly inattentive type: Secondary | ICD-10-CM | POA: Diagnosis not present

## 2020-03-02 DIAGNOSIS — F322 Major depressive disorder, single episode, severe without psychotic features: Secondary | ICD-10-CM | POA: Diagnosis not present

## 2020-03-03 ENCOUNTER — Other Ambulatory Visit: Payer: Self-pay

## 2020-03-03 ENCOUNTER — Ambulatory Visit (INDEPENDENT_AMBULATORY_CARE_PROVIDER_SITE_OTHER): Payer: BC Managed Care – PPO | Admitting: Orthopaedic Surgery

## 2020-03-03 VITALS — Ht 67.0 in | Wt >= 6400 oz

## 2020-03-03 DIAGNOSIS — Z6841 Body Mass Index (BMI) 40.0 and over, adult: Secondary | ICD-10-CM | POA: Diagnosis not present

## 2020-03-03 DIAGNOSIS — M1612 Unilateral primary osteoarthritis, left hip: Secondary | ICD-10-CM | POA: Diagnosis not present

## 2020-03-03 MED ORDER — TIZANIDINE HCL 4 MG PO TABS: 4.0000 mg | ORAL_TABLET | Freq: Three times a day (TID) | ORAL | 1 refills | Status: DC | PRN

## 2020-03-03 MED ORDER — METHYLPREDNISOLONE 4 MG PO TABS: ORAL_TABLET | ORAL | 0 refills | Status: DC

## 2020-03-03 NOTE — Progress Notes (Signed)
The patient is a 54 year old female well-known to me.  She has debilitating arthritis of her left hip.  We wanted to see her back today for a weight check and BMI calculation.  She has been ambulate with a cane.  Her pain is daily in the left hip and is severe.  She has had at least 1 intra-articular steroid injection of the left hip about 4 months ago.  Her weight today is 401 pounds with a BMI of 62.8.  This is actually up from her last visit.  I had a long thorough discussion with her.  We are unable to proceed with any type of hip replacement surgery until she loses a significant amount of weight.  I did give her a note to keep her off of jury duty and information to take to the [facility] for a temporary handicap placard.  We will try muscle relaxant for the spasm she is having and I recommended Voltaren gel on top of the meloxicam she takes.  We can see her back in 3 months for repeat weight and BMI calculation but she understands we cannot proceed with surgery until her numbers significantly improved in terms of weight and BMI.

## 2020-03-10 DIAGNOSIS — M25552 Pain in left hip: Secondary | ICD-10-CM | POA: Diagnosis not present

## 2020-03-10 DIAGNOSIS — F509 Eating disorder, unspecified: Secondary | ICD-10-CM | POA: Diagnosis not present

## 2020-03-10 DIAGNOSIS — F909 Attention-deficit hyperactivity disorder, unspecified type: Secondary | ICD-10-CM | POA: Diagnosis not present

## 2020-03-15 ENCOUNTER — Encounter: Payer: BC Managed Care – PPO | Attending: Family Medicine | Admitting: Registered"

## 2020-03-15 DIAGNOSIS — F509 Eating disorder, unspecified: Secondary | ICD-10-CM | POA: Insufficient documentation

## 2020-03-15 DIAGNOSIS — F5081 Binge eating disorder: Secondary | ICD-10-CM | POA: Diagnosis not present

## 2020-03-15 DIAGNOSIS — Z713 Dietary counseling and surveillance: Secondary | ICD-10-CM

## 2020-03-15 NOTE — Progress Notes (Signed)
This visit was completed virtually due to the COVID-19 pandemic.   I spoke with Ashley Charles and verified that I was speaking with the correct person with two patient identifiers (full name and date of birth).   I discussed the limitations related to this kind of visit and the patient is willing to proceed.  This note is not being shared with the patient for the following reason: To prevent harm (release of this note would result in harm to the life or physical safety of the patient or another).   Medical Nutrition Therapy:  Appt start time: 3:00  end time: 4:00  Therapist: [name]; meets bi-weekly  Assessment:  Primary concerns today: BED  Recent lab results from 12/01/19: elevated A1c 5.7, elevated Chol 207; Trg, HDL, Vit D, TSH, and CMP all within normal limits.   Pt states she saw surgeon on 7/1. Next appt is 10/4. States she gained 10 lbs during 3 months. States she received oral steroids and muscle relaxer. States she has been going to outdoor pool a few times. Reports getting in and out of the pool have been easier the more she goes. States she does not want to have bariatric surgery because its a lifetime of malnourishment. States she wants to eat the same thing most days and will eat what the family eats if they want something different from what she's eating.  States she has pool exercises she can do provided by physical therapist. Reports doing exercises to build up stamina. States she is seeing less swelling and inflammation since eating less gluten.   Uses escapism as a coping mechanism: Facebook/tv/M&Ms  24 hour recall:  B: [employer] instant breakfast + protein powder + 2% milk  S: L: [employer] instant breakfast + protein powder + 2% milk S:  D: [employer]-grilled chicken market salad (lite balsamic vinaigrette)  S:   Beverages: water only  Physical activity: pool exercises to loosen hip  Nutritional Diagnosis:  NB-1.5 Disordered eating pattern As related to meal  skipping and binge eating.  As evidenced by dietary recall.    Intervention: Nutrition counseling provided. Encouraged pt with having 3 times of eating during the day to help with meeting needs of her body. Encouraged with movement and the benefits it has on mobility.   Monitoring/Evaluation: will follow up in 4 weeks.

## 2020-03-17 DIAGNOSIS — F909 Attention-deficit hyperactivity disorder, unspecified type: Secondary | ICD-10-CM | POA: Diagnosis not present

## 2020-03-17 DIAGNOSIS — F509 Eating disorder, unspecified: Secondary | ICD-10-CM | POA: Diagnosis not present

## 2020-03-22 DIAGNOSIS — M25552 Pain in left hip: Secondary | ICD-10-CM | POA: Diagnosis not present

## 2020-03-24 DIAGNOSIS — F909 Attention-deficit hyperactivity disorder, unspecified type: Secondary | ICD-10-CM | POA: Diagnosis not present

## 2020-03-24 DIAGNOSIS — F509 Eating disorder, unspecified: Secondary | ICD-10-CM | POA: Diagnosis not present

## 2020-03-25 DIAGNOSIS — G4733 Obstructive sleep apnea (adult) (pediatric): Secondary | ICD-10-CM | POA: Diagnosis not present

## 2020-03-29 DIAGNOSIS — M25552 Pain in left hip: Secondary | ICD-10-CM | POA: Diagnosis not present

## 2020-04-05 DIAGNOSIS — M25552 Pain in left hip: Secondary | ICD-10-CM | POA: Diagnosis not present

## 2020-04-07 DIAGNOSIS — F509 Eating disorder, unspecified: Secondary | ICD-10-CM | POA: Diagnosis not present

## 2020-04-07 DIAGNOSIS — F909 Attention-deficit hyperactivity disorder, unspecified type: Secondary | ICD-10-CM | POA: Diagnosis not present

## 2020-04-13 ENCOUNTER — Telehealth: Payer: BC Managed Care – PPO | Admitting: Registered"

## 2020-04-18 ENCOUNTER — Other Ambulatory Visit: Payer: Self-pay | Admitting: Orthopaedic Surgery

## 2020-04-18 DIAGNOSIS — S6992XA Unspecified injury of left wrist, hand and finger(s), initial encounter: Secondary | ICD-10-CM | POA: Diagnosis not present

## 2020-04-20 ENCOUNTER — Encounter: Payer: BC Managed Care – PPO | Attending: Family Medicine | Admitting: Registered"

## 2020-04-20 DIAGNOSIS — F5081 Binge eating disorder: Secondary | ICD-10-CM | POA: Insufficient documentation

## 2020-04-20 DIAGNOSIS — F509 Eating disorder, unspecified: Secondary | ICD-10-CM | POA: Insufficient documentation

## 2020-04-21 DIAGNOSIS — M25552 Pain in left hip: Secondary | ICD-10-CM | POA: Diagnosis not present

## 2020-04-29 IMAGING — CR DG KNEE 1-2V*L*
2 series · 2 of 2 positions shown · non-contrast
Comparison: None.

CLINICAL DATA: Chronic left knee pain.  No known injury.

EXAM:
LEFT KNEE - 1-2 VIEW

[w knee ap left]
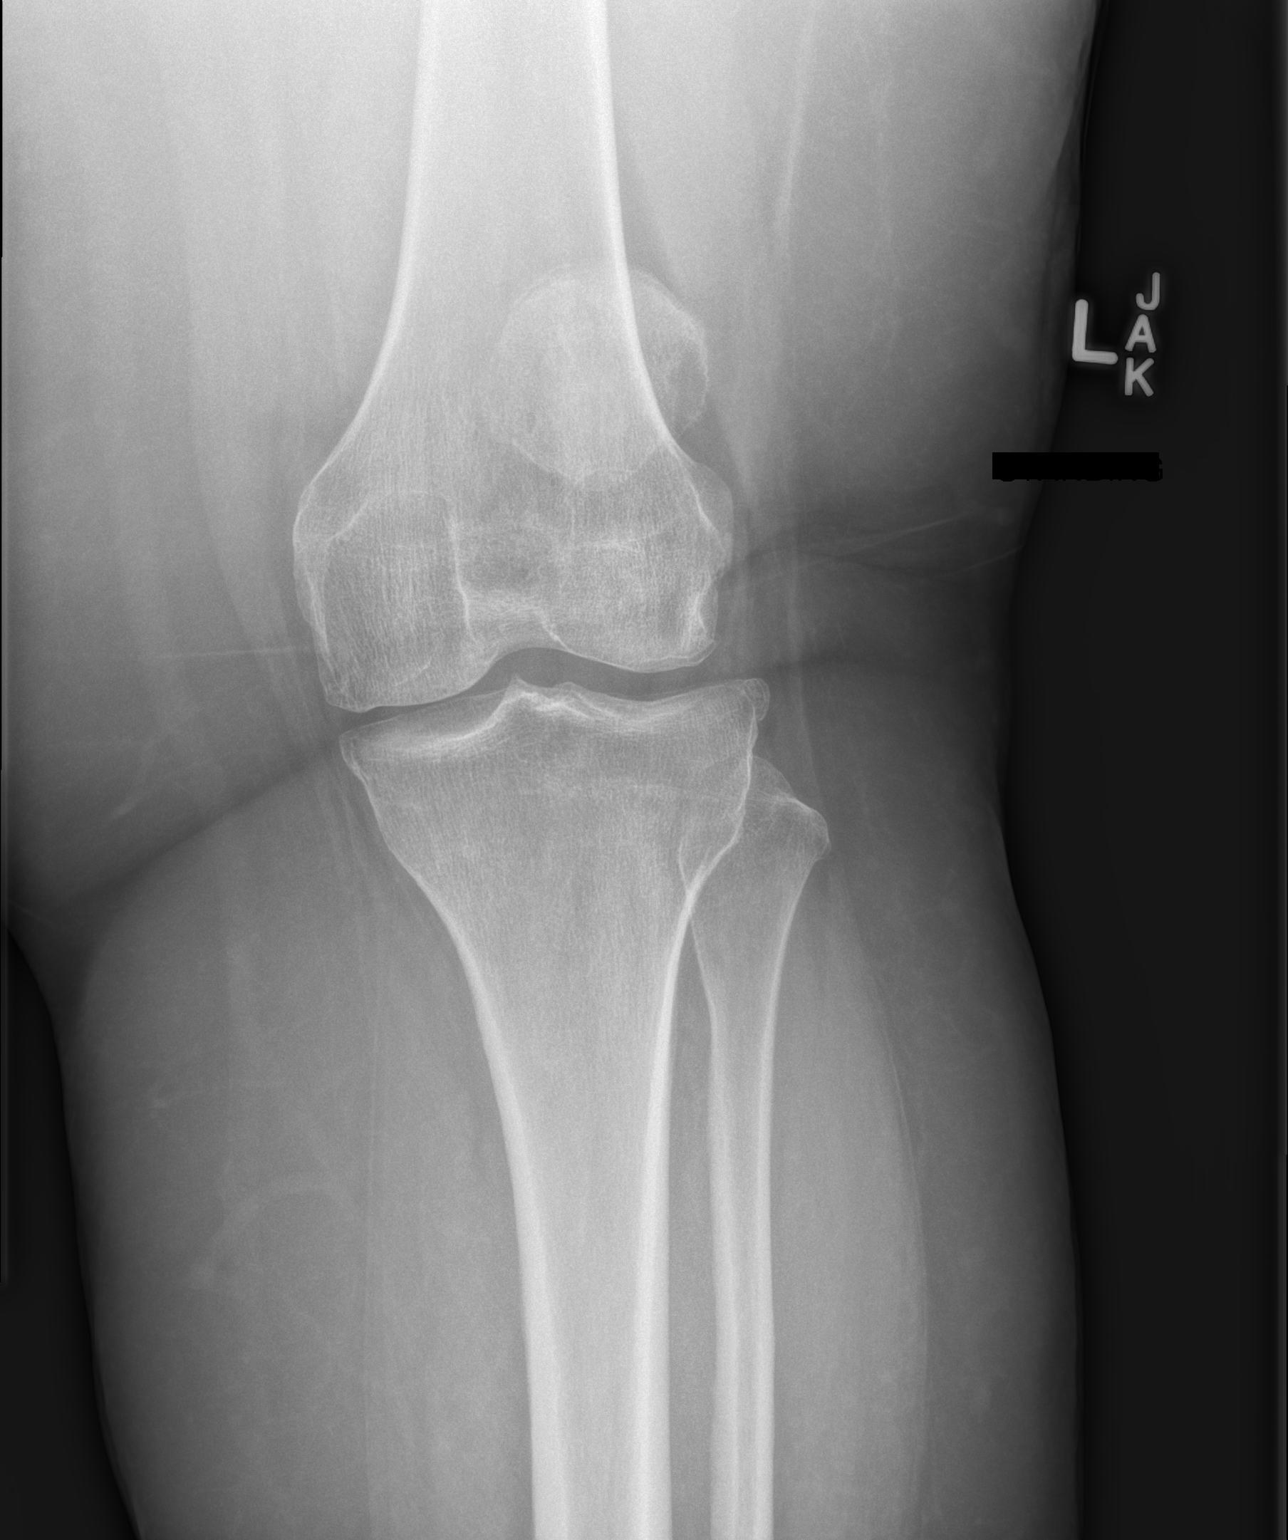

[w knee lat left]
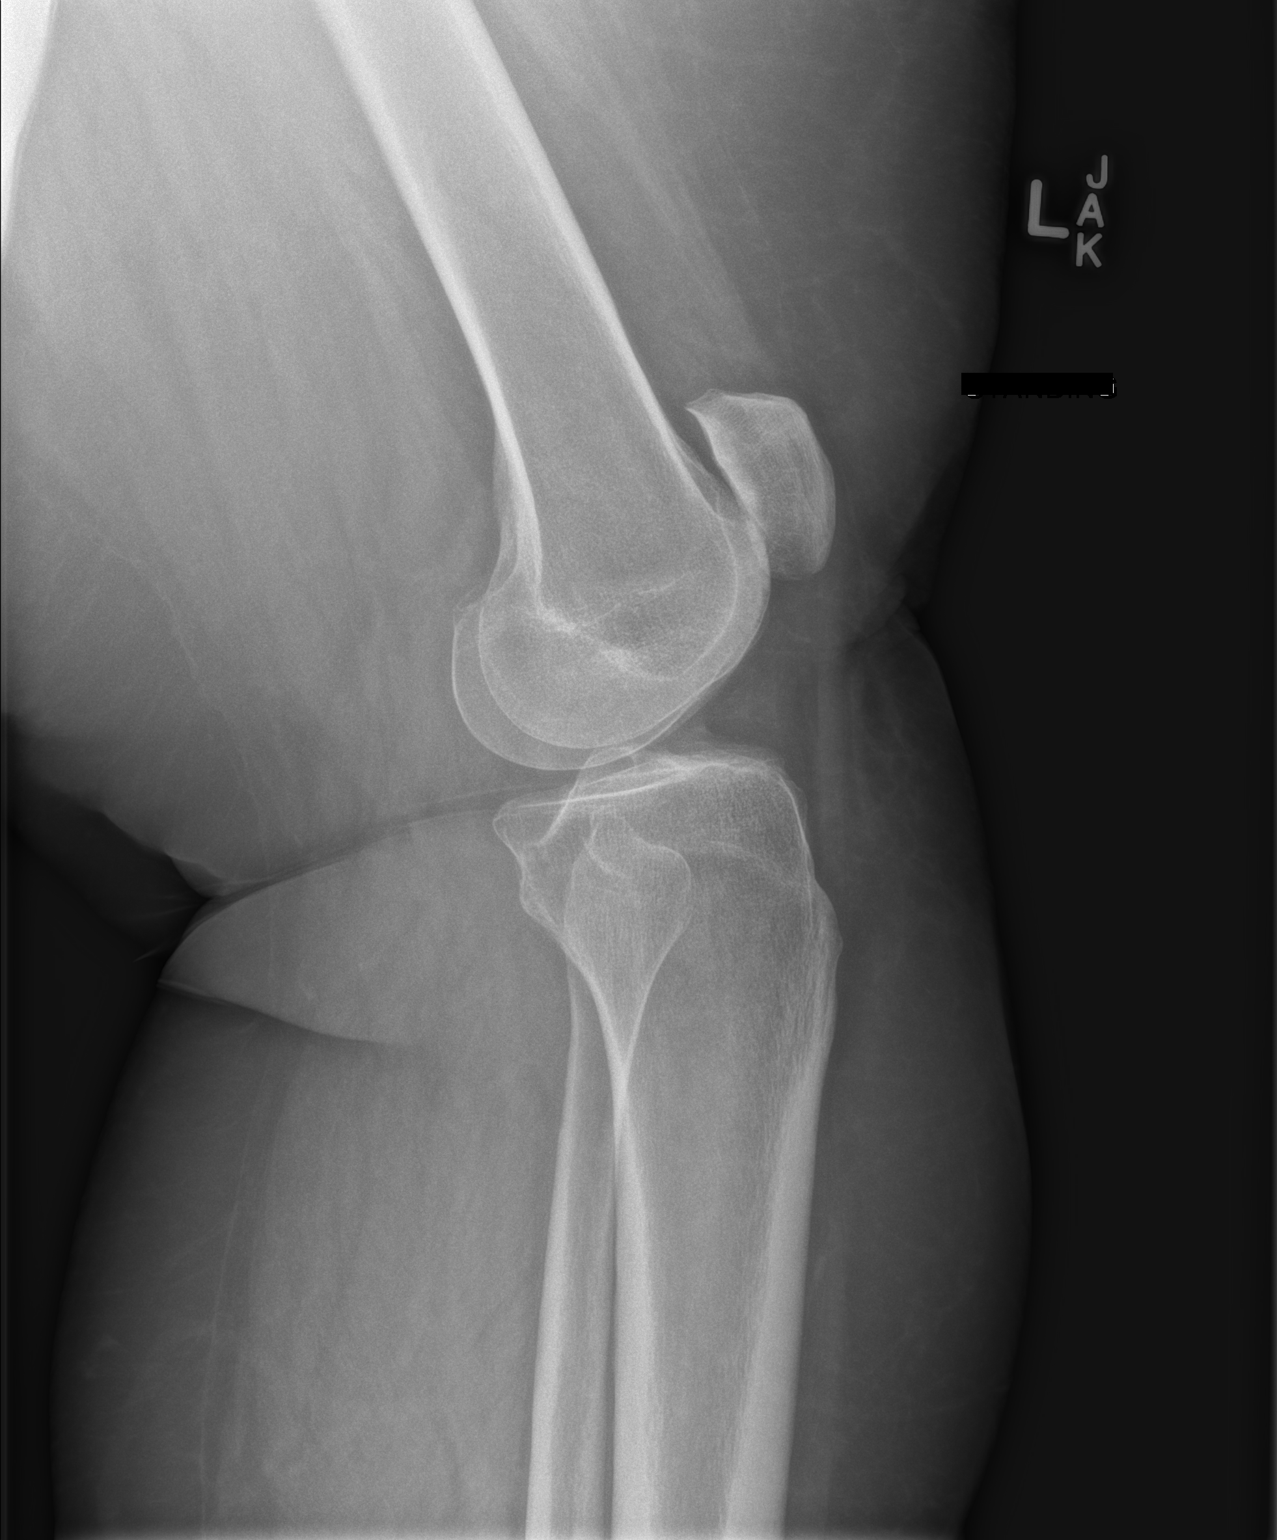

[2 of 2 positions shown; findings below may reference images not displayed]

FINDINGS: Mild tricompartmental degenerative changes. No effusion or fracture.
IMPRESSION: Mild tricompartmental degenerative changes.

## 2020-05-30 ENCOUNTER — Other Ambulatory Visit: Payer: Self-pay | Admitting: Orthopaedic Surgery

## 2020-05-30 NOTE — Telephone Encounter (Signed)
Please advise 

## 2020-06-06 ENCOUNTER — Ambulatory Visit: Payer: BC Managed Care – PPO | Admitting: Orthopaedic Surgery

## 2020-07-04 ENCOUNTER — Other Ambulatory Visit: Payer: Self-pay | Admitting: Orthopaedic Surgery

## 2020-08-30 ENCOUNTER — Other Ambulatory Visit: Payer: Self-pay | Admitting: Orthopaedic Surgery

## 2020-10-07 ENCOUNTER — Other Ambulatory Visit: Payer: Self-pay | Admitting: Orthopaedic Surgery

## 2020-10-07 NOTE — Telephone Encounter (Signed)
Please advise 

## 2020-12-09 ENCOUNTER — Other Ambulatory Visit: Payer: Self-pay | Admitting: Orthopaedic Surgery

## 2020-12-12 NOTE — Telephone Encounter (Signed)
Ok to refill 

## 2021-01-02 ENCOUNTER — Ambulatory Visit (INDEPENDENT_AMBULATORY_CARE_PROVIDER_SITE_OTHER): Payer: Medicaid Other

## 2021-01-02 ENCOUNTER — Ambulatory Visit (INDEPENDENT_AMBULATORY_CARE_PROVIDER_SITE_OTHER): Payer: 59 | Admitting: Orthopaedic Surgery

## 2021-01-02 ENCOUNTER — Ambulatory Visit (INDEPENDENT_AMBULATORY_CARE_PROVIDER_SITE_OTHER): Payer: 59

## 2021-01-02 ENCOUNTER — Encounter: Payer: Self-pay | Admitting: Orthopaedic Surgery

## 2021-01-02 VITALS — Ht 68.0 in | Wt 383.6 lb

## 2021-01-02 DIAGNOSIS — M1612 Unilateral primary osteoarthritis, left hip: Secondary | ICD-10-CM | POA: Diagnosis not present

## 2021-01-02 DIAGNOSIS — Z6841 Body Mass Index (BMI) 40.0 and over, adult: Secondary | ICD-10-CM | POA: Diagnosis not present

## 2021-01-02 DIAGNOSIS — M1712 Unilateral primary osteoarthritis, left knee: Secondary | ICD-10-CM

## 2021-01-02 NOTE — Progress Notes (Signed)
Office Visit Note   Patient: Ashley Charles           Date of Birth: [birth year]           MRN: [id number] Visit Date: 01/02/2021              Requested by: Sigmund Hazel, MD [id number] [place],  [place] [address] PCP: Sigmund Hazel, MD   Assessment & Plan: Visit Diagnoses:  1. Unilateral primary osteoarthritis, left hip   2. Unilateral primary osteoarthritis, left knee     Plan: I did congratulate her today on her weight loss and I do feel that if she continues on this journey she will eventually be able to have her hip replaced on the left side.  She would really need to have a BMI of around 45 for me to be comfortable with proceeding with that surgery.  She also needs understand that some insurance stipulations mandate that a BMI be below 40.  We gave her a copy of her x-rays and I talked her in length about things.  Follow-up here is as needed but I am happy to also reach out to her at any time.  Follow-Up Instructions: Return if symptoms worsen or fail to improve.   Orders:  Orders Placed This Encounter  Procedures  . XR HIP UNILAT W OR W/O PELVIS 2-3 VIEWS LEFT  . XR Knee 1-2 Views Left   No orders of the defined types were placed in this encounter.     Procedures: No procedures performed   Clinical Data: No additional findings.   Subjective: Chief Complaint  Patient presents with  . Left Hip - Pain  . Left Knee - Pain  The patient is well-known to me.  She is a 55 year old female with severe end-stage arthritis of her left hip.  She has been battling morbid obesity and working on weight loss.  When I saw her in July of last year her BMI was 62.8 with a weight of 401 pounds.  She does ambulate with a rolling walker.  Today her weight is down to 383.6 pounds with a BMI of 58.34.  She still deals with significant left hip and groin pain as well as left knee pain.  Both knees hyperextend but the left knee does more so.  She has a remote history of a Lisfranc  injury to her left foot.  She has had no other acute change in her medical status.  She continues to work on her weight loss journey.  She did let us know that she is moving to [facility] by the end of the week.  HPI  Review of Systems There is currently listed no headache, chest pain, shortness of breath, fever, chills, nausea, vomiting  Objective: Vital Signs: Ht 5\' 8"  (1.727 m)   Wt (!) 383 lb 9.6 oz (174 kg)   BMI 58.33 kg/m   Physical Exam She is alert and orient x3 and in no acute distress Ortho Exam Examination of her left hip shows pain with internal and external rotation as well as stiffness with rotation.  Her right hip moves normally.  Both knees hyperextend of the left knee does more so than the right knee.  There is significant patellofemoral potation with the left knee as well but feels ligamentously stable. Specialty Comments:  No specialty comments available.  Imaging: XR HIP UNILAT W OR W/O PELVIS 2-3 VIEWS LEFT  Result Date: 01/02/2021 An AP pelvis and lateral of the left hip  shows severe end-stage arthritis of the left hip with complete loss of joint space, particular osteophytes and sclerotic changes in the femoral head and acetabulum.  XR Knee 1-2 Views Left  Result Date: 01/02/2021 2 views left knee show moderate arthritis at patellofemoral joint but well-maintained medial and lateral compartments.  The alignment is well-maintained.    PMFS History: Patient Active Problem List   Diagnosis Date Noted  . BMI 60.0-69.9, adult (HCC) 03/03/2020  . Unilateral primary osteoarthritis, left hip 12/07/2019   Past Medical History:  Diagnosis Date  . Sleep apnea     Family History  Problem Relation Age of Onset  . Anxiety disorder Mother   . Stroke Mother   . Congestive Heart Failure Mother   . Atrial fibrillation Mother   . Hypertension Father   . Depression Father   . CVA Father   . Anxiety disorder Brother   . Diabetes Brother   . Hypertension  Brother     Past Surgical History:  Procedure Laterality Date  . CESAREAN SECTION    . FOOT SURGERY    . TONSILLECTOMY     Social History   Occupational History  . Not on file  Tobacco Use  . Smoking status: Not on file  . Smokeless tobacco: Not on file  Substance and Sexual Activity  . Alcohol use: Not on file  . Drug use: Not on file  . Sexual activity: Not on file

## 2021-01-05 ENCOUNTER — Other Ambulatory Visit: Payer: Self-pay | Admitting: Orthopaedic Surgery

## 2021-01-05 MED ORDER — METHYLPREDNISOLONE 4 MG PO TABS: 4.0000 mg | ORAL_TABLET | ORAL | 0 refills | Status: AC

## 2021-02-07 ENCOUNTER — Encounter: Payer: Self-pay | Admitting: Orthopaedic Surgery

## 2021-02-07 ENCOUNTER — Other Ambulatory Visit: Payer: Self-pay | Admitting: Orthopaedic Surgery

## 2021-02-08 ENCOUNTER — Other Ambulatory Visit: Payer: Self-pay

## 2021-02-08 MED ORDER — TIZANIDINE HCL 4 MG PO TABS: 4.0000 mg | ORAL_TABLET | Freq: Three times a day (TID) | ORAL | 1 refills | Status: DC | PRN

## 2021-03-17 ENCOUNTER — Other Ambulatory Visit: Payer: Self-pay | Admitting: Orthopaedic Surgery

## 2021-03-17 NOTE — Telephone Encounter (Signed)
ok 

## 2021-05-27 ENCOUNTER — Other Ambulatory Visit: Payer: Self-pay | Admitting: Orthopaedic Surgery

## 2021-05-29 NOTE — Telephone Encounter (Signed)
Please advise 

## 2021-07-19 ENCOUNTER — Other Ambulatory Visit: Payer: Self-pay | Admitting: Orthopaedic Surgery

## 2021-08-30 ENCOUNTER — Other Ambulatory Visit: Payer: Self-pay | Admitting: Orthopaedic Surgery

## 2021-10-11 ENCOUNTER — Other Ambulatory Visit: Payer: Self-pay | Admitting: Orthopaedic Surgery

## 2021-11-29 ENCOUNTER — Other Ambulatory Visit: Payer: Self-pay | Admitting: Orthopaedic Surgery

## 2022-01-18 ENCOUNTER — Other Ambulatory Visit: Payer: Self-pay | Admitting: Orthopaedic Surgery

## 2022-03-04 ENCOUNTER — Other Ambulatory Visit: Payer: Self-pay | Admitting: Orthopaedic Surgery

## 2022-04-19 ENCOUNTER — Other Ambulatory Visit: Payer: Self-pay | Admitting: Physician Assistant

## 2022-05-28 ENCOUNTER — Other Ambulatory Visit: Payer: Self-pay | Admitting: Physician Assistant

## 2022-11-08 ENCOUNTER — Encounter: Payer: Self-pay | Admitting: Radiology
